# Patient Record
Sex: Male | Born: 1955 | Race: White | Hispanic: No | Marital: Married | Smoking: Never smoker
Health system: Southern US, Community
[De-identification: ages and names within clinical notes are randomized; demographics above are authoritative.]

## PROBLEM LIST (undated history)

## (undated) DIAGNOSIS — Z87442 Personal history of urinary calculi: Secondary | ICD-10-CM

## (undated) DIAGNOSIS — M545 Low back pain, unspecified: Secondary | ICD-10-CM

## (undated) DIAGNOSIS — R931 Abnormal findings on diagnostic imaging of heart and coronary circulation: Secondary | ICD-10-CM

## (undated) DIAGNOSIS — I341 Nonrheumatic mitral (valve) prolapse: Secondary | ICD-10-CM

## (undated) DIAGNOSIS — M720 Palmar fascial fibromatosis [Dupuytren]: Secondary | ICD-10-CM

## (undated) DIAGNOSIS — G500 Trigeminal neuralgia: Secondary | ICD-10-CM

## (undated) DIAGNOSIS — M1990 Unspecified osteoarthritis, unspecified site: Secondary | ICD-10-CM

## (undated) DIAGNOSIS — E119 Type 2 diabetes mellitus without complications: Secondary | ICD-10-CM

## (undated) HISTORY — DX: Type 2 diabetes mellitus without complications: E11.9

## (undated) HISTORY — PX: TRIGGER FINGER RELEASE: SHX641

## (undated) HISTORY — PX: KNEE SURGERY: SHX244

## (undated) HISTORY — DX: Palmar fascial fibromatosis (dupuytren): M72.0

## (undated) HISTORY — DX: Low back pain: M54.5

## (undated) HISTORY — DX: Abnormal findings on diagnostic imaging of heart and coronary circulation: R93.1

## (undated) HISTORY — PX: HIP SURGERY: SHX245

## (undated) HISTORY — DX: Trigeminal neuralgia: G50.0

## (undated) HISTORY — DX: Nonrheumatic mitral (valve) prolapse: I34.1

## (undated) HISTORY — PX: HAND SURGERY: SHX662

## (undated) HISTORY — DX: Low back pain, unspecified: M54.50

---

## 2006-10-21 ENCOUNTER — Ambulatory Visit: Payer: Self-pay

## 2006-12-16 ENCOUNTER — Ambulatory Visit: Payer: Self-pay | Admitting: General Practice

## 2006-12-23 ENCOUNTER — Ambulatory Visit: Payer: Self-pay | Admitting: General Practice

## 2007-05-18 ENCOUNTER — Ambulatory Visit: Payer: Self-pay | Admitting: Gastroenterology

## 2008-06-01 ENCOUNTER — Ambulatory Visit: Payer: Self-pay | Admitting: Orthopedic Surgery

## 2008-06-07 ENCOUNTER — Ambulatory Visit: Payer: Self-pay | Admitting: Orthopedic Surgery

## 2009-07-30 ENCOUNTER — Ambulatory Visit: Payer: Self-pay | Admitting: General Surgery

## 2009-12-25 ENCOUNTER — Ambulatory Visit (HOSPITAL_COMMUNITY): Admission: RE | Admit: 2009-12-25 | Discharge: 2009-12-25 | Payer: Self-pay | Admitting: Orthopedic Surgery

## 2011-06-02 ENCOUNTER — Ambulatory Visit: Payer: Self-pay | Admitting: Orthopedic Surgery

## 2011-06-12 ENCOUNTER — Ambulatory Visit: Payer: Self-pay | Admitting: Orthopedic Surgery

## 2011-08-25 DIAGNOSIS — M25559 Pain in unspecified hip: Secondary | ICD-10-CM | POA: Insufficient documentation

## 2011-09-02 DIAGNOSIS — S73191A Other sprain of right hip, initial encounter: Secondary | ICD-10-CM | POA: Insufficient documentation

## 2011-12-15 DIAGNOSIS — M5416 Radiculopathy, lumbar region: Secondary | ICD-10-CM | POA: Insufficient documentation

## 2012-04-29 ENCOUNTER — Other Ambulatory Visit: Payer: Self-pay

## 2013-02-15 ENCOUNTER — Emergency Department: Payer: Self-pay | Admitting: Emergency Medicine

## 2013-02-15 LAB — URINALYSIS, COMPLETE
Bacteria: NONE SEEN
Bilirubin,UR: NEGATIVE
Glucose,UR: >=500 mg/dL (ref 0–75)
Leukocyte Esterase: NEGATIVE
Nitrite: NEGATIVE
Ph: 8 (ref 4.5–8.0)
Protein: 30
RBC,UR: 62 /HPF (ref 0–5)
Specific Gravity: 1.02 (ref 1.003–1.030)
Squamous Epithelial: <1
WBC UR: 2 /HPF (ref 0–5)

## 2013-02-15 LAB — COMPREHENSIVE METABOLIC PANEL
Albumin: 3.8 g/dL (ref 3.4–5.0)
Alkaline Phosphatase: 66 U/L (ref 50–136)
Anion Gap: 2 — ABNORMAL LOW (ref 7–16)
BUN: 20 mg/dL — ABNORMAL HIGH (ref 7–18)
Bilirubin,Total: 0.5 mg/dL (ref 0.2–1.0)
Calcium, Total: 9.1 mg/dL (ref 8.5–10.1)
Chloride: 104 mmol/L (ref 98–107)
Co2: 30 mmol/L (ref 21–32)
Creatinine: 1.24 mg/dL (ref 0.60–1.30)
EGFR (African American): >60
EGFR (Non-African Amer.): >60
Glucose: 200 mg/dL — ABNORMAL HIGH (ref 65–99)
Osmolality: 280 (ref 275–301)
Potassium: 4.2 mmol/L (ref 3.5–5.1)
SGOT(AST): 25 U/L (ref 15–37)
SGPT (ALT): 20 U/L (ref 12–78)
Sodium: 136 mmol/L (ref 136–145)
Total Protein: 7.1 g/dL (ref 6.4–8.2)

## 2013-02-15 LAB — CBC
HCT: 39.2 % — ABNORMAL LOW (ref 40.0–52.0)
HGB: 13.7 g/dL (ref 13.0–18.0)
MCH: 32.1 pg (ref 26.0–34.0)
MCHC: 35 g/dL (ref 32.0–36.0)
MCV: 92 fL (ref 80–100)
Platelet: 211 10*3/uL (ref 150–440)
RBC: 4.28 10*6/uL — ABNORMAL LOW (ref 4.40–5.90)
RDW: 12.5 % (ref 11.5–14.5)
WBC: 8.7 10*3/uL (ref 3.8–10.6)

## 2013-02-15 LAB — LIPASE, BLOOD: Lipase: 133 U/L (ref 73–393)

## 2013-03-13 ENCOUNTER — Ambulatory Visit: Payer: Self-pay | Admitting: Podiatry

## 2014-07-27 DIAGNOSIS — M76892 Other specified enthesopathies of left lower limb, excluding foot: Secondary | ICD-10-CM | POA: Insufficient documentation

## 2014-09-17 ENCOUNTER — Ambulatory Visit
Admit: 2014-09-17 | Disposition: A | Discharge status: Home / Self Care | Payer: Self-pay | Attending: Gastroenterology | Admitting: Gastroenterology

## 2014-10-12 ENCOUNTER — Emergency Department
Admission: EM | Admit: 2014-10-12 | Discharge: 2014-10-13 | Disposition: A | Discharge status: Home / Self Care | Payer: No Typology Code available for payment source | Attending: Emergency Medicine | Admitting: Emergency Medicine

## 2014-10-12 ENCOUNTER — Encounter: Payer: Self-pay | Admitting: *Deleted

## 2014-10-12 ENCOUNTER — Emergency Department: Payer: No Typology Code available for payment source

## 2014-10-12 ENCOUNTER — Other Ambulatory Visit: Payer: Self-pay

## 2014-10-12 DIAGNOSIS — Z79899 Other long term (current) drug therapy: Secondary | ICD-10-CM | POA: Diagnosis not present

## 2014-10-12 DIAGNOSIS — R0789 Other chest pain: Secondary | ICD-10-CM

## 2014-10-12 DIAGNOSIS — S3991XA Unspecified injury of abdomen, initial encounter: Secondary | ICD-10-CM | POA: Diagnosis not present

## 2014-10-12 DIAGNOSIS — Y92410 Unspecified street and highway as the place of occurrence of the external cause: Secondary | ICD-10-CM | POA: Insufficient documentation

## 2014-10-12 DIAGNOSIS — Y998 Other external cause status: Secondary | ICD-10-CM | POA: Diagnosis not present

## 2014-10-12 DIAGNOSIS — R079 Chest pain, unspecified: Secondary | ICD-10-CM

## 2014-10-12 DIAGNOSIS — S299XXA Unspecified injury of thorax, initial encounter: Secondary | ICD-10-CM | POA: Insufficient documentation

## 2014-10-12 DIAGNOSIS — S199XXA Unspecified injury of neck, initial encounter: Secondary | ICD-10-CM | POA: Insufficient documentation

## 2014-10-12 DIAGNOSIS — S3992XA Unspecified injury of lower back, initial encounter: Secondary | ICD-10-CM | POA: Diagnosis not present

## 2014-10-12 DIAGNOSIS — S0990XA Unspecified injury of head, initial encounter: Secondary | ICD-10-CM

## 2014-10-12 DIAGNOSIS — Z7982 Long term (current) use of aspirin: Secondary | ICD-10-CM | POA: Insufficient documentation

## 2014-10-12 DIAGNOSIS — Y9389 Activity, other specified: Secondary | ICD-10-CM | POA: Diagnosis not present

## 2014-10-12 MED ORDER — CYCLOBENZAPRINE HCL 10 MG PO TABS
5.0000 mg | ORAL_TABLET | Freq: Once | ORAL | Status: AC
  Administered 2014-10-13: 5 mg via ORAL

## 2014-10-12 MED ORDER — OXYCODONE-ACETAMINOPHEN 5-325 MG PO TABS: 1.0000 | ORAL_TABLET | Freq: Four times a day (QID) | ORAL | Status: DC | PRN

## 2014-10-12 MED ORDER — METHOCARBAMOL 500 MG PO TABS: 500.0000 mg | ORAL_TABLET | Freq: Four times a day (QID) | ORAL | Status: DC

## 2014-10-12 MED ORDER — OXYCODONE-ACETAMINOPHEN 5-325 MG PO TABS
1.0000 | ORAL_TABLET | Freq: Once | ORAL | Status: AC
  Administered 2014-10-13: 1 via ORAL

## 2014-10-12 NOTE — ED Provider Notes (Signed)
ED ECG REPORT I, Charlsie Fleeger W, the attending physician, personally viewed and interpreted this ECG.   Date: 10/12/2014  EKG Time: 2155  Rate: 66  Rhythm: normal EKG, normal sinus rhythm, unchanged from previous tracings  Axis: 61  Intervals: within normal limits  ST&T Change: no ST changes   Ahmed Prima, MD 10/12/14 2340
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
## 2014-10-13 MED ORDER — OXYCODONE-ACETAMINOPHEN 5-325 MG PO TABS
ORAL_TABLET | ORAL | Status: AC
  Administered 2014-10-13: 1 via ORAL
  Filled 2014-10-13: qty 1

## 2014-10-13 MED ORDER — CYCLOBENZAPRINE HCL 10 MG PO TABS
ORAL_TABLET | ORAL | Status: AC
  Administered 2014-10-13: 5 mg via ORAL
  Filled 2014-10-13: qty 1
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
## 2014-10-24 ENCOUNTER — Encounter: Admission: RE | Payer: Self-pay | Source: Ambulatory Visit

## 2014-10-24 ENCOUNTER — Ambulatory Visit: Admission: RE | Admit: 2014-10-24 | Payer: Self-pay | Source: Ambulatory Visit | Admitting: Gastroenterology

## 2014-10-24 SURGERY — MANOMETRY, ESOPHAGUS

## 2014-10-29 ENCOUNTER — Encounter: Payer: Self-pay | Admitting: Family Medicine

## 2015-02-19 ENCOUNTER — Ambulatory Visit (INDEPENDENT_AMBULATORY_CARE_PROVIDER_SITE_OTHER): Payer: BLUE CROSS/BLUE SHIELD | Admitting: Family Medicine

## 2015-02-19 ENCOUNTER — Encounter: Payer: Self-pay | Admitting: Family Medicine

## 2015-02-19 VITALS — BP 138/81 | HR 59 | Temp 97.4°F | Ht 72.6 in | Wt 196.0 lb

## 2015-02-19 DIAGNOSIS — Z0000 Encounter for general adult medical examination without abnormal findings: Secondary | ICD-10-CM | POA: Diagnosis not present

## 2015-02-19 DIAGNOSIS — E119 Type 2 diabetes mellitus without complications: Secondary | ICD-10-CM

## 2015-02-19 LAB — URINALYSIS, ROUTINE W REFLEX MICROSCOPIC
Bilirubin, UA: NEGATIVE
Glucose, UA: NEGATIVE
Leukocytes, UA: NEGATIVE
Nitrite, UA: NEGATIVE
Protein, UA: NEGATIVE
RBC, UA: NEGATIVE
Specific Gravity, UA: 1.02 (ref 1.005–1.030)
Urobilinogen, Ur: 0.2 mg/dL (ref 0.2–1.0)
pH, UA: 5.5 (ref 5.0–7.5)

## 2015-02-19 NOTE — Progress Notes (Signed)
   BP 138/81 mmHg  Pulse 59  Temp(Src) 97.4 F (36.3 C)  Ht 6' 0.6" (1.844 m)  Wt 196 lb (88.905 kg)  BMI 26.15 kg/m2  SpO2 99%   Subjective:    Patient ID: Christopher Robins., male    DOB: 1956-02-13, 59 y.o.   MRN: [id number]  HPI: Christopher Fridman. is a 59 y.o. male  Chief Complaint  Patient presents with  . Annual Exam    status post labral tear repair left hip doing well no further pain.  Relevant past medical, surgical, family and social history reviewed and updated as indicated. Interim medical history since our last visit reviewed. Allergies and medications reviewed and updated.  Review of Systems  Constitutional: Negative.   HENT: Negative.   Eyes: Negative.   Respiratory: Negative.   Cardiovascular: Negative.   Gastrointestinal: Negative.   Endocrine: Negative.   Genitourinary: Negative.   Musculoskeletal: Negative.   Skin: Negative.   Allergic/Immunologic: Negative.   Neurological: Negative.   Hematological: Negative.   Psychiatric/Behavioral: Negative.     Per HPI unless specifically indicated above     Objective:    BP 138/81 mmHg  Pulse 59  Temp(Src) 97.4 F (36.3 C)  Ht 6' 0.6" (1.844 m)  Wt 196 lb (88.905 kg)  BMI 26.15 kg/m2  SpO2 99%  Wt Readings from Last 3 Encounters:  02/19/15 196 lb (88.905 kg)  10/12/14 200 lb (90.719 kg)  08/29/14 199 lb (90.266 kg)    Physical Exam  Constitutional: He is oriented to person, place, and time. He appears well-developed and well-nourished.  HENT:  Head: Normocephalic and atraumatic.  Right Ear: External ear normal.  Left Ear: External ear normal.  Eyes: Conjunctivae and EOM are normal. Pupils are equal, round, and reactive to light.  Neck: Normal range of motion. Neck supple.  Cardiovascular: Normal rate, regular rhythm, normal heart sounds and intact distal pulses.   Pulmonary/Chest: Effort normal and breath sounds normal.  Abdominal: Soft. Bowel sounds are normal. There is no  splenomegaly or hepatomegaly.  Genitourinary: Rectum normal, prostate normal and penis normal.  Musculoskeletal: Normal range of motion.  Multiple varicosities both legs  Neurological: He is alert and oriented to person, place, and time. He has normal reflexes.  Skin: No rash noted. No erythema.  Psychiatric: He has a normal mood and affect. His behavior is normal. Judgment and thought content normal.        Assessment & Plan:   Problem List Items Addressed This Visit    None    Visit Diagnoses    PE (physical exam), annual    -  Primary    Relevant Orders    Comprehensive metabolic panel    Lipid panel    CBC with Differential/Platelet    PSA    Urinalysis, Routine w reflex microscopic (not at [facility])    TSH        Follow up plan: Return if symptoms worsen or fail to improve, for Physical Exam.

## 2015-02-20 ENCOUNTER — Encounter: Payer: Self-pay | Admitting: Family Medicine

## 2015-02-20 DIAGNOSIS — E119 Type 2 diabetes mellitus without complications: Secondary | ICD-10-CM

## 2015-02-20 HISTORY — DX: Type 2 diabetes mellitus without complications: E11.9

## 2015-02-20 LAB — COMPREHENSIVE METABOLIC PANEL
ALT: 15 IU/L (ref 0–44)
AST: 21 IU/L (ref 0–40)
Albumin/Globulin Ratio: 1.8 (ref 1.1–2.5)
Albumin: 4.3 g/dL (ref 3.5–5.5)
Alkaline Phosphatase: 57 IU/L (ref 39–117)
BUN/Creatinine Ratio: 13 (ref 9–20)
BUN: 13 mg/dL (ref 6–24)
Bilirubin Total: 0.6 mg/dL (ref 0.0–1.2)
CO2: 25 mmol/L (ref 18–29)
Calcium: 9.5 mg/dL (ref 8.7–10.2)
Chloride: 98 mmol/L (ref 97–108)
Creatinine, Ser: 0.97 mg/dL (ref 0.76–1.27)
GFR calc Af Amer: 100 mL/min/{1.73_m2} (ref >59)
GFR calc non Af Amer: 86 mL/min/{1.73_m2} (ref >59)
Globulin, Total: 2.4 g/dL (ref 1.5–4.5)
Glucose: 126 mg/dL — ABNORMAL HIGH (ref 65–99)
Potassium: 4.4 mmol/L (ref 3.5–5.2)
Sodium: 139 mmol/L (ref 134–144)
Total Protein: 6.7 g/dL (ref 6.0–8.5)

## 2015-02-20 LAB — CBC WITH DIFFERENTIAL/PLATELET
Basophils Absolute: 0 10*3/uL (ref 0.0–0.2)
Basos: 1 %
EOS (ABSOLUTE): 0.1 10*3/uL (ref 0.0–0.4)
Eos: 3 %
Hematocrit: 42.2 % (ref 37.5–51.0)
Hemoglobin: 14.4 g/dL (ref 12.6–17.7)
Immature Grans (Abs): 0 10*3/uL (ref 0.0–0.1)
Immature Granulocytes: 0 %
Lymphocytes Absolute: 1.4 10*3/uL (ref 0.7–3.1)
Lymphs: 38 %
MCH: 31.2 pg (ref 26.6–33.0)
MCHC: 34.1 g/dL (ref 31.5–35.7)
MCV: 92 fL (ref 79–97)
Monocytes Absolute: 0.4 10*3/uL (ref 0.1–0.9)
Monocytes: 11 %
Neutrophils Absolute: 1.8 10*3/uL (ref 1.4–7.0)
Neutrophils: 47 %
Platelets: 259 10*3/uL (ref 150–379)
RBC: 4.61 x10E6/uL (ref 4.14–5.80)
RDW: 12.5 % (ref 12.3–15.4)
WBC: 3.8 10*3/uL (ref 3.4–10.8)

## 2015-02-20 LAB — LIPID PANEL
Chol/HDL Ratio: 4.1 ratio units (ref 0.0–5.0)
Cholesterol, Total: 224 mg/dL — ABNORMAL HIGH (ref 100–199)
HDL: 54 mg/dL (ref >39)
LDL Calculated: 159 mg/dL — ABNORMAL HIGH (ref 0–99)
Triglycerides: 54 mg/dL (ref 0–149)
VLDL Cholesterol Cal: 11 mg/dL (ref 5–40)

## 2015-02-20 LAB — PSA: Prostate Specific Ag, Serum: 1 ng/mL (ref 0.0–4.0)

## 2015-02-20 LAB — TSH: TSH: 2.79 u[IU]/mL (ref 0.450–4.500)

## 2015-02-20 NOTE — Addendum Note (Signed)
Addended byGolden Pop on: 02/20/2015 10:57 AM   Modules accepted: Orders, SmartSet

## 2015-02-21 ENCOUNTER — Encounter: Payer: Self-pay | Admitting: Family Medicine

## 2015-02-21 LAB — SPECIMEN STATUS REPORT

## 2015-02-21 LAB — HGB A1C W/O EAG: Hgb A1c MFr Bld: 6.5 % — ABNORMAL HIGH (ref 4.8–5.6)

## 2015-03-08 ENCOUNTER — Telehealth: Payer: Self-pay | Admitting: Unknown Physician Specialty

## 2015-03-08 ENCOUNTER — Encounter: Payer: Self-pay | Admitting: Unknown Physician Specialty

## 2015-03-08 ENCOUNTER — Ambulatory Visit (INDEPENDENT_AMBULATORY_CARE_PROVIDER_SITE_OTHER): Payer: BLUE CROSS/BLUE SHIELD | Admitting: Unknown Physician Specialty

## 2015-03-08 VITALS — BP 117/74 | HR 79 | Temp 98.5°F | Ht 73.0 in | Wt 202.8 lb

## 2015-03-08 DIAGNOSIS — J029 Acute pharyngitis, unspecified: Secondary | ICD-10-CM | POA: Diagnosis not present

## 2015-03-08 DIAGNOSIS — J069 Acute upper respiratory infection, unspecified: Secondary | ICD-10-CM | POA: Diagnosis not present

## 2015-03-08 DIAGNOSIS — R21 Rash and other nonspecific skin eruption: Secondary | ICD-10-CM

## 2015-03-08 LAB — CBC WITH DIFFERENTIAL/PLATELET
Hematocrit: 41 % (ref 37.5–51.0)
Hemoglobin: 13.9 g/dL (ref 12.6–17.7)
Lymphocytes Absolute: 1.4 10*3/uL (ref 0.7–3.1)
Lymphs: 13 %
MCH: 31.7 pg (ref 26.6–33.0)
MCHC: 33.9 g/dL (ref 31.5–35.7)
MCV: 94 fL (ref 79–97)
MID (Absolute): 0.6 10*3/uL (ref 0.1–1.6)
MID: 5 %
Neutrophils Absolute: 9 10*3/uL — ABNORMAL HIGH (ref 1.4–7.0)
Neutrophils: 82 %
Platelets: 224 10*3/uL (ref 150–379)
RBC: 4.38 x10E6/uL (ref 4.14–5.80)
RDW: 12.6 % (ref 12.3–15.4)
WBC: 11 10*3/uL — ABNORMAL HIGH (ref 3.4–10.8)

## 2015-03-08 NOTE — Progress Notes (Signed)
BP 117/74 mmHg  Pulse 79  Temp(Src) 98.5 F (36.9 C)  Ht 6\' 1"  (1.854 m)  Wt 202 lb 12.8 oz (91.989 kg)  BMI 26.76 kg/m2  SpO2 100%   Subjective:    Patient ID: Christopher Robins., male    DOB: Feb 16, 1956, 59 y.o.   MRN: [id number]  HPI: Christopher Meuser. is a 59 y.o. male  Chief Complaint  Patient presents with  . Headache  . Nasal Congestion  . Cough  . Sore Throat  . Fatigue  . Rash   URI  This is a new problem. Episode onset: For 3 days. The problem has been unchanged. There has been no fever. Associated symptoms include coughing, a rash, rhinorrhea and a sore throat. He has tried decongestant and antihistamine for the symptoms. The treatment provided no relief.   Pt states he is getting a "chill" he gets a hive type rash on his arms that does not itch.  It fades after about 30 minutes.    Relevant past medical, surgical, family and social history reviewed and updated as indicated. Interim medical history since our last visit reviewed. Allergies and medications reviewed and updated.  Review of Systems  HENT: Positive for rhinorrhea and sore throat.   Respiratory: Positive for cough.   Skin: Positive for rash.    Per HPI unless specifically indicated above     Objective:    BP 117/74 mmHg  Pulse 79  Temp(Src) 98.5 F (36.9 C)  Ht 6\' 1"  (1.854 m)  Wt 202 lb 12.8 oz (91.989 kg)  BMI 26.76 kg/m2  SpO2 100%  Wt Readings from Last 3 Encounters:  03/08/15 202 lb 12.8 oz (91.989 kg)  02/19/15 196 lb (88.905 kg)  10/12/14 200 lb (90.719 kg)    Physical Exam  Constitutional: He is oriented to person, place, and time. He appears well-developed and well-nourished. No distress.  HENT:  Head: Normocephalic and atraumatic.  Right Ear: Tympanic membrane and ear canal normal.  Left Ear: Tympanic membrane and ear canal normal.  Nose: Rhinorrhea present. Right sinus exhibits no maxillary sinus tenderness and no frontal sinus tenderness. Left sinus exhibits no  maxillary sinus tenderness and no frontal sinus tenderness.  Mouth/Throat: Uvula is midline. Posterior oropharyngeal edema present.  Eyes: Conjunctivae and lids are normal. Right eye exhibits no discharge. Left eye exhibits no discharge. No scleral icterus.  Neck: Neck supple.  Cardiovascular: Normal rate, regular rhythm and normal heart sounds.   Pulmonary/Chest: Effort normal and breath sounds normal. No respiratory distress.  Abdominal: Normal appearance. There is no splenomegaly or hepatomegaly.  Musculoskeletal: Normal range of motion.  Neurological: He is alert and oriented to person, place, and time.  Skin: Skin is warm, dry and intact. Rash noted. Rash is not pustular. No pallor.  Flat blotchy erythemetous rash  Psychiatric: He has a normal mood and affect. His behavior is normal. Judgment and thought content normal.  Nursing note and vitals reviewed.  Strep i negative WBC is 11K    Assessment & Plan:   Problem List Items Addressed This Visit    None    Visit Diagnoses    URI (upper respiratory infection)    -  Primary    Relevant Orders    CBC With Differential/Platelet    Strep Gp A Ag, IA W/Reflex    Pharyngitis        Relevant Orders    CBC With Differential/Platelet    Strep Gp A Ag, IA W/Reflex  Rash        Relevant Orders    CBC With Differential/Platelet    Strep Gp A Ag, IA W/Reflex       Discussed with Dr. Wynetta Emery who came in and evaluated patient.  Probably viral exanthem.   Discussed blod sugar while here.  Encouraged 6 month f/u  Follow up plan: Return if symptoms worsen or fail to improve.

## 2015-03-08 NOTE — Telephone Encounter (Signed)
Pt has been added to CW's schedule @ 1:30pm for a sick visit. Thanks.

## 2015-03-11 LAB — STREP GP A AG, IA W/REFLEX: Strep A Culture: NEGATIVE

## 2015-07-09 ENCOUNTER — Encounter: Payer: Self-pay | Admitting: Family Medicine

## 2015-08-12 ENCOUNTER — Telehealth: Payer: Self-pay | Admitting: Family Medicine

## 2015-08-12 MED ORDER — OSELTAMIVIR PHOSPHATE 75 MG PO CAPS: 75.0000 mg | ORAL_CAPSULE | Freq: Every day | ORAL | Status: DC

## 2015-08-12 NOTE — Telephone Encounter (Signed)
Pts wife has been diagnosed with the flu and he would like to have tamiflu called in to [facility] on [address]. He would like a call to know when its done. thanks

## 2015-08-12 NOTE — Telephone Encounter (Signed)
Expand All Collapse All   Pts wife has been diagnosed with the flu and he would like to have tamiflu called in to [facility] on [address]. He would like a call to know when its done. thanks       dome

## 2015-08-14 ENCOUNTER — Telehealth: Payer: Self-pay | Admitting: Family Medicine

## 2015-08-14 NOTE — Telephone Encounter (Signed)
Patient to up Tamiflu to BID per MJ

## 2015-08-14 NOTE — Telephone Encounter (Signed)
Pt started taking tamiflu 2 days ago but now feels worse (mucous is now brown and has temp of 101) and wondered if he should wait it out and continue with it or if he needs to come in for an appt to make sure its not pneumonia.

## 2015-08-15 ENCOUNTER — Telehealth: Payer: Self-pay | Admitting: Family Medicine

## 2015-08-15 NOTE — Telephone Encounter (Signed)
Patient has questions about his flu symptoms and would like to talk to [place] about it or a provider. He stated that he has a worse sore throat, maybe thinks its strep throat. His number is [phone].

## 2015-08-15 NOTE — Telephone Encounter (Signed)
Patient made appointment for tomorrow 08/16/15.

## 2015-08-16 ENCOUNTER — Encounter: Payer: Self-pay | Admitting: Family Medicine

## 2015-08-16 ENCOUNTER — Ambulatory Visit (INDEPENDENT_AMBULATORY_CARE_PROVIDER_SITE_OTHER): Payer: BLUE CROSS/BLUE SHIELD | Admitting: Family Medicine

## 2015-08-16 VITALS — BP 122/77 | HR 70 | Temp 97.8°F | Ht 73.0 in | Wt 200.8 lb

## 2015-08-16 DIAGNOSIS — J111 Influenza due to unidentified influenza virus with other respiratory manifestations: Secondary | ICD-10-CM

## 2015-08-16 DIAGNOSIS — J029 Acute pharyngitis, unspecified: Secondary | ICD-10-CM | POA: Diagnosis not present

## 2015-08-16 NOTE — Progress Notes (Signed)
BP 122/77 mmHg  Pulse 70  Temp(Src) 97.8 F (36.6 C)  Ht 6\' 1"  (1.854 m)  Wt 200 lb 12.8 oz (91.082 kg)  BMI 26.50 kg/m2  SpO2 99%   Subjective:    Patient ID: Christopher Robins., male    DOB: 27-Feb-1956, 60 y.o.   MRN: [id number]  HPI: Christopher Dacres. is a 60 y.o. male  Chief Complaint  Patient presents with  . Influenza    Patient stated he is getting over the flu. His throat and ear now hurts.   . Sore Throat  . Ear Pain    Left Ear   UPPER RESPIRATORY TRACT INFECTION- still on tamiflu, has only been on it for 2 days Duration: Tuesday Worst symptom: sore throat Fever: yes Cough: yes Shortness of breath: no Wheezing: no Chest pain: no Chest tightness: yes Chest congestion: yes Nasal congestion: no Runny nose: yes Post nasal drip: no Sneezing: yes Sore throat: yes Swollen glands: yes Sinus pressure: no Headache: yes Face pain: no Toothache: no Ear pain: yes left Ear pressure: no  Eyes red/itching:no Eye drainage/crusting: no  Vomiting: no Rash: no Fatigue: yes Sick contacts: yes Strep contacts: no  Context: better Recurrent sinusitis: no Relief with OTC cold/cough medications: no  Treatments attempted: tamiflu   Relevant past medical, surgical, family and social history reviewed and updated as indicated. Interim medical history since our last visit reviewed. Allergies and medications reviewed and updated.  Review of Systems  Constitutional: Negative.   HENT: Positive for congestion, ear pain, postnasal drip, sinus pressure and sore throat. Negative for dental problem, drooling, ear discharge, facial swelling, hearing loss, mouth sores, nosebleeds, rhinorrhea, sneezing, tinnitus, trouble swallowing and voice change.   Respiratory: Positive for cough. Negative for apnea, choking, chest tightness, shortness of breath, wheezing and stridor.   Cardiovascular: Negative for chest pain, palpitations and leg swelling.  Psychiatric/Behavioral:  Negative.     Per HPI unless specifically indicated above     Objective:    BP 122/77 mmHg  Pulse 70  Temp(Src) 97.8 F (36.6 C)  Ht 6\' 1"  (1.854 m)  Wt 200 lb 12.8 oz (91.082 kg)  BMI 26.50 kg/m2  SpO2 99%  Wt Readings from Last 3 Encounters:  08/16/15 200 lb 12.8 oz (91.082 kg)  03/08/15 202 lb 12.8 oz (91.989 kg)  02/19/15 196 lb (88.905 kg)    Physical Exam  Constitutional: He is oriented to person, place, and time. He appears well-developed and well-nourished. No distress.  HENT:  Head: Normocephalic and atraumatic.  Right Ear: Hearing and external ear normal.  Left Ear: Hearing and external ear normal.  Nose: Nose normal.  Mouth/Throat: Oropharynx is clear and moist. No oropharyngeal exudate.  Eyes: Conjunctivae, EOM and lids are normal. Pupils are equal, round, and reactive to light. Right eye exhibits no discharge. Left eye exhibits no discharge. No scleral icterus.  Neck: Normal range of motion. Neck supple. No JVD present. No tracheal deviation present. No thyromegaly present.  Cardiovascular: Normal rate, regular rhythm, normal heart sounds and intact distal pulses.  Exam reveals no gallop and no friction rub.   No murmur heard. Pulmonary/Chest: Effort normal and breath sounds normal. No stridor. No respiratory distress. He has no wheezes. He has no rales. He exhibits no tenderness.  Musculoskeletal: Normal range of motion.  Lymphadenopathy:    He has cervical adenopathy.  Neurological: He is alert and oriented to person, place, and time.  Skin: Skin is warm, dry and intact. No  rash noted. He is not diaphoretic. No erythema. No pallor.  Psychiatric: He has a normal mood and affect. His speech is normal and behavior is normal. Judgment and thought content normal. Cognition and memory are normal.  Nursing note and vitals reviewed.   Results for orders placed or performed in visit on 03/08/15  Strep Gp A Ag, IA W/Reflex  Result Value Ref Range   Strep A Culture  Negative   CBC With Differential/Platelet  Result Value Ref Range   WBC 11.0 (H) 3.4 - 10.8 x10E3/uL   RBC 4.38 4.14 - 5.80 x10E6/uL   Hemoglobin 13.9 12.6 - 17.7 g/dL   Hematocrit 41.0 37.5 - 51.0 %   MCV 94 79 - 97 fL   MCH 31.7 26.6 - 33.0 pg   MCHC 33.9 31.5 - 35.7 g/dL   RDW 12.6 12.3 - 15.4 %   Platelets 224 150 - 379 x10E3/uL   Neutrophils 82 %   Lymphs 13 %   MID 5 %   Neutrophils Absolute 9.0 (H) 1.4 - 7.0 x10E3/uL   Lymphocytes Absolute 1.4 0.7 - 3.1 x10E3/uL   MID (Absolute) 0.6 0.1 - 1.6 X10E3/uL      Assessment & Plan:   Problem List Items Addressed This Visit    None    Visit Diagnoses    Influenza    -  Primary    Discussed nature of flu and natural progression. Will continue tamiflu until it's done. Out of work for 1 week after initially got sick (Tuesday). Call if worse    Sore throat        Strep negative     Relevant Orders    Rapid strep screen (not at [facility])        Follow up plan: Return if symptoms worsen or fail to improve.

## 2015-08-19 LAB — CULTURE, GROUP A STREP

## 2015-08-19 LAB — RAPID STREP SCREEN (MED CTR MEBANE ONLY): Strep Gp A Ag, IA W/Reflex: NEGATIVE

## 2015-08-20 ENCOUNTER — Telehealth: Payer: Self-pay | Admitting: Family Medicine

## 2015-08-20 MED ORDER — AMOXICILLIN 500 MG PO CAPS: 500.0000 mg | ORAL_CAPSULE | Freq: Two times a day (BID) | ORAL | Status: DC

## 2015-08-20 NOTE — Telephone Encounter (Signed)
Please let him know that his throat culture grew out strep. Not usually the strep that causes strep throat, but I'm going to treat him with an antibiotic. I sent it to his pharmacy- any questions let me know

## 2015-08-20 NOTE — Telephone Encounter (Signed)
Tried to call patient, no answer, unable to leave a message. Will try again tomorrow.  

## 2015-08-21 NOTE — Telephone Encounter (Signed)
Tried to call patient, no answer, unable to leave a message. 

## 2015-08-21 NOTE — Telephone Encounter (Signed)
Tried to call patient, no answer.  Spoke with pharmacy, patient has not picked up Rx

## 2015-08-23 ENCOUNTER — Encounter: Payer: Self-pay | Admitting: Family Medicine

## 2015-08-23 NOTE — Telephone Encounter (Signed)
Let's send him a letter. Thanks

## 2015-08-23 NOTE — Telephone Encounter (Signed)
Unable to contact patient by phone. Letter generated and to be sent.

## 2015-11-01 ENCOUNTER — Ambulatory Visit (INDEPENDENT_AMBULATORY_CARE_PROVIDER_SITE_OTHER): Payer: BLUE CROSS/BLUE SHIELD | Admitting: Family Medicine

## 2015-11-01 ENCOUNTER — Encounter: Payer: Self-pay | Admitting: Family Medicine

## 2015-11-01 VITALS — BP 124/72 | HR 68 | Temp 98.3°F | Wt 202.0 lb

## 2015-11-01 DIAGNOSIS — I8002 Phlebitis and thrombophlebitis of superficial vessels of left lower extremity: Secondary | ICD-10-CM | POA: Diagnosis not present

## 2015-11-01 DIAGNOSIS — E119 Type 2 diabetes mellitus without complications: Secondary | ICD-10-CM | POA: Diagnosis not present

## 2015-11-01 DIAGNOSIS — R7301 Impaired fasting glucose: Secondary | ICD-10-CM | POA: Diagnosis not present

## 2015-11-01 LAB — BAYER DCA HB A1C WAIVED: HB A1C (BAYER DCA - WAIVED): 6.2 % (ref <7.0)

## 2015-11-01 MED ORDER — SULFAMETHOXAZOLE-TRIMETHOPRIM 800-160 MG PO TABS: 1.0000 | ORAL_TABLET | Freq: Two times a day (BID) | ORAL | Status: DC

## 2015-11-01 NOTE — Progress Notes (Signed)
BP 124/72 mmHg  Pulse 68  Temp(Src) 98.3 F (36.8 C)  Wt 202 lb (91.627 kg)  SpO2 99%   Subjective:    Patient ID: Christopher Robins., male    DOB: 03-23-1956, 60 y.o.   MRN: [id number]  HPI: Christopher Harbold. is a 60 y.o. male  Chief Complaint  Patient presents with  . Leg Pain    left lower leg, patient states that about two weeks ago he had some fever and heat in his leg, then some nummbness in his toes. He hasnt had pain just numbess, and feverish   DIABETES Hypoglycemic episodes:no Polydipsia/polyuria: no Visual disturbance: no Chest pain: no Paresthesias: no Glucose Monitoring: no Taking Insulin?: no Blood Pressure Monitoring: not checking Retinal Examination: Up to Date Foot Exam: Up to Date Diabetic Education: Completed Pneumovax: Not up to Date Influenza: Not up to Date Aspirin: yes  SKIN INFECTION Duration:  Location: anterior L shin History of trauma in area: yes Pain: yes Quality: aching and numb Severity: mildH3 Redness: yes Swelling: yes Oozing: no Pus: no Fevers: no Nausea/vomiting: no Status: better Treatments attempted:compression stockings  Tetanus: UTD  Relevant past medical, surgical, family and social history reviewed and updated as indicated. Interim medical history since our last visit reviewed. Allergies and medications reviewed and updated.  Review of Systems  Constitutional: Negative.   Respiratory: Negative.   Cardiovascular: Negative.   Psychiatric/Behavioral: Negative.     Per HPI unless specifically indicated above     Objective:    BP 124/72 mmHg  Pulse 68  Temp(Src) 98.3 F (36.8 C)  Wt 202 lb (91.627 kg)  SpO2 99%  Wt Readings from Last 3 Encounters:  11/01/15 202 lb (91.627 kg)  08/16/15 200 lb 12.8 oz (91.082 kg)  03/08/15 202 lb 12.8 oz (91.989 kg)    Physical Exam  Constitutional: He is oriented to person, place, and time. He appears well-developed and well-nourished. No distress.  HENT:  Head:  Normocephalic and atraumatic.  Right Ear: Hearing normal.  Left Ear: Hearing normal.  Nose: Nose normal.  Eyes: Conjunctivae and lids are normal. Right eye exhibits no discharge. Left eye exhibits no discharge. No scleral icterus.  Cardiovascular: Normal rate, regular rhythm, normal heart sounds and intact distal pulses.  Exam reveals no gallop and no friction rub.   No murmur heard. Pulmonary/Chest: Effort normal and breath sounds normal. No respiratory distress. He has no wheezes. He has no rales. He exhibits no tenderness.  Musculoskeletal: Normal range of motion.  Swollen veins on anterior L shin with heat and redness  Neurological: He is alert and oriented to person, place, and time.  Skin: Skin is warm, dry and intact. No rash noted. No erythema. No pallor.  Psychiatric: He has a normal mood and affect. His speech is normal and behavior is normal. Judgment and thought content normal. Cognition and memory are normal.  Nursing note and vitals reviewed.   Results for orders placed or performed in visit on 08/16/15  Rapid strep screen (not at [facility])  Result Value Ref Range   Strep Gp A Ag, IA W/Reflex Negative Negative  Culture, Group A Strep  Result Value Ref Range   Strep A Culture Comment (A)       Assessment & Plan:   Problem List Items Addressed This Visit      Endocrine   IFG (impaired fasting glucose)    Now IFG. Continue diet and exercise. Continue to monitor.      RESOLVED:  Type 2 diabetes mellitus without complication (HCC)   Relevant Orders   Bayer DCA Hb A1c Waived    Other Visit Diagnoses    Thrombophlebitis leg superficial, left    -  Primary    Will treat with bactrim. Call with any concern or if not getting better.         Follow up plan: Return if symptoms worsen or fail to improve.

## 2015-11-01 NOTE — Assessment & Plan Note (Signed)
Now IFG. Continue diet and exercise. Continue to monitor.

## 2016-02-06 ENCOUNTER — Encounter (INDEPENDENT_AMBULATORY_CARE_PROVIDER_SITE_OTHER): Payer: Self-pay

## 2016-02-13 DIAGNOSIS — M7050 Other bursitis of knee, unspecified knee: Secondary | ICD-10-CM | POA: Diagnosis not present

## 2016-02-13 DIAGNOSIS — M25562 Pain in left knee: Secondary | ICD-10-CM | POA: Diagnosis not present

## 2016-02-20 ENCOUNTER — Encounter: Payer: BLUE CROSS/BLUE SHIELD | Admitting: Family Medicine

## 2016-02-26 ENCOUNTER — Telehealth: Payer: Self-pay | Admitting: Family Medicine

## 2016-02-26 ENCOUNTER — Encounter: Payer: Self-pay | Admitting: Family Medicine

## 2016-02-26 ENCOUNTER — Ambulatory Visit (INDEPENDENT_AMBULATORY_CARE_PROVIDER_SITE_OTHER): Payer: BLUE CROSS/BLUE SHIELD | Admitting: Family Medicine

## 2016-02-26 VITALS — BP 146/79 | HR 56 | Temp 97.5°F | Ht 72.6 in | Wt 203.0 lb

## 2016-02-26 DIAGNOSIS — Z114 Encounter for screening for human immunodeficiency virus [HIV]: Secondary | ICD-10-CM

## 2016-02-26 DIAGNOSIS — E119 Type 2 diabetes mellitus without complications: Secondary | ICD-10-CM | POA: Diagnosis not present

## 2016-02-26 DIAGNOSIS — Z0000 Encounter for general adult medical examination without abnormal findings: Secondary | ICD-10-CM | POA: Diagnosis not present

## 2016-02-26 DIAGNOSIS — Z1159 Encounter for screening for other viral diseases: Secondary | ICD-10-CM

## 2016-02-26 LAB — URINALYSIS, ROUTINE W REFLEX MICROSCOPIC
Bilirubin, UA: NEGATIVE
Glucose, UA: NEGATIVE
Ketones, UA: NEGATIVE
Leukocytes, UA: NEGATIVE
Nitrite, UA: NEGATIVE
Protein, UA: NEGATIVE
RBC, UA: NEGATIVE
Specific Gravity, UA: <1.005 — ABNORMAL LOW (ref 1.005–1.030)
Urobilinogen, Ur: 0.2 mg/dL (ref 0.2–1.0)
pH, UA: 5.5 (ref 5.0–7.5)

## 2016-02-26 LAB — MICROALBUMIN, URINE WAIVED
Creatinine, Urine Waived: 50 mg/dL (ref 10–300)
Microalb, Ur Waived: 10 mg/L (ref 0–19)
Microalb/Creat Ratio: <30 mg/g (ref <30)

## 2016-02-26 LAB — BAYER DCA HB A1C WAIVED: HB A1C (BAYER DCA - WAIVED): 6.3 % (ref <7.0)

## 2016-02-26 MED ORDER — METFORMIN HCL 500 MG PO TABS: 500.0000 mg | ORAL_TABLET | Freq: Two times a day (BID) | ORAL | 4 refills | Status: DC

## 2016-02-26 MED ORDER — METFORMIN HCL 500 MG PO TABS: 500.0000 mg | ORAL_TABLET | Freq: Every day | ORAL | 4 refills | Status: DC

## 2016-02-26 NOTE — Assessment & Plan Note (Signed)
Discussed type 2 diabetes and use of metformin will start metformin 500 mg at bedtime one a day

## 2016-02-26 NOTE — Telephone Encounter (Signed)
Ms Einbinder called and stated that the pt would like a call back with dosage clarification for his metFORMIN (GLUCOPHAGE) 500 MG tablet.

## 2016-02-26 NOTE — Telephone Encounter (Signed)
Patient states he was told to take Metformin QHS, Rx says BID.  He knows QHS is correct

## 2016-02-26 NOTE — Progress Notes (Signed)
BP (!) 146/79 (BP Location: Left Arm, Patient Position: Sitting, Cuff Size: Normal)   Pulse (!) 56   Temp 97.5 F (36.4 C)   Ht 6' 0.6" (1.844 m)   Wt 203 lb (92.1 kg)   SpO2 95%   BMI 27.08 kg/m    Subjective:    Patient ID: Christopher Robins., male    DOB: [birth year], 60 y.o.   MRN: [id number]  HPI: Christopher Zachry. is a 60 y.o. male  Chief Complaint  Patient presents with  . Annual Exam  Patient follow-up all in all doing well still having some intermittent nonspecific right. Umbilical discomfort from time to time workup about 4 years ago was entirely negative including ultrasound and endoscopy. Presumed to be a intermittent muscle strain or hernia. Patient also on chart review last hemoglobin A year ago was 6.5 discussed use of metformin prediabetes and diagnosis of diabetes.  Relevant past medical, surgical, family and social history reviewed and updated as indicated. Interim medical history since our last visit reviewed. Allergies and medications reviewed and updated.  Review of Systems  Constitutional: Negative.   HENT: Negative.   Eyes: Negative.   Respiratory: Negative.   Cardiovascular: Negative.   Gastrointestinal: Negative.   Endocrine: Negative.   Genitourinary: Negative.   Musculoskeletal: Negative.   Skin: Negative.   Allergic/Immunologic: Negative.   Neurological: Negative.   Hematological: Negative.   Psychiatric/Behavioral: Negative.     Per HPI unless specifically indicated above     Objective:    BP (!) 146/79 (BP Location: Left Arm, Patient Position: Sitting, Cuff Size: Normal)   Pulse (!) 56   Temp 97.5 F (36.4 C)   Ht 6' 0.6" (1.844 m)   Wt 203 lb (92.1 kg)   SpO2 95%   BMI 27.08 kg/m   Wt Readings from Last 3 Encounters:  02/26/16 203 lb (92.1 kg)  11/01/15 202 lb (91.6 kg)  08/16/15 200 lb 12.8 oz (91.1 kg)    Physical Exam  Constitutional: He is oriented to person, place, and time. He appears well-developed and  well-nourished.  HENT:  Head: Normocephalic and atraumatic.  Right Ear: External ear normal.  Left Ear: External ear normal.  Eyes: Conjunctivae and EOM are normal. Pupils are equal, round, and reactive to light.  Neck: Normal range of motion. Neck supple.  Cardiovascular: Normal rate, regular rhythm, normal heart sounds and intact distal pulses.   Pulmonary/Chest: Effort normal and breath sounds normal.  Abdominal: Soft. Bowel sounds are normal. There is no splenomegaly or hepatomegaly.  Genitourinary: Rectum normal, prostate normal and penis normal.  Musculoskeletal: Normal range of motion.  Neurological: He is alert and oriented to person, place, and time. He has normal reflexes.  Skin: No rash noted. No erythema.  Psychiatric: He has a normal mood and affect. His behavior is normal. Judgment and thought content normal.    Results for orders placed or performed in visit on 11/01/15  Bayer DCA Hb A1c Waived  Result Value Ref Range   Bayer DCA Hb A1c Waived 6.2 <7.0 %      Assessment & Plan:   Problem List Items Addressed This Visit      Endocrine   Diabetes mellitus type 2 in nonobese [facility])    Discussed type 2 diabetes and use of metformin will start metformin 500 mg at bedtime one a day      Relevant Medications   metFORMIN (GLUCOPHAGE) 500 MG tablet    Other Visit Diagnoses  Annual physical exam    -  Primary   Relevant Orders   Urinalysis, Routine w reflex microscopic (not at [facility])   CBC with Differential/Platelet   Comprehensive metabolic panel   Lipid Panel w/o Chol/HDL Ratio   PSA   TSH   Diabetes mellitus without complication (HCC)       Relevant Medications   metFORMIN (GLUCOPHAGE) 500 MG tablet   Other Relevant Orders   Bayer DCA Hb A1c Waived   Microalbumin, Urine Waived   Need for hepatitis C screening test       Relevant Orders   Hepatitis C Antibody   Screening for HIV without presence of risk factors       Relevant Orders   HIV antibody         Follow up plan: Return in about 6 months (around 08/26/2016) for Hemoglobin A1c.

## 2016-02-27 LAB — CBC WITH DIFFERENTIAL/PLATELET
Basophils Absolute: 0.1 10*3/uL (ref 0.0–0.2)
Basos: 2 %
EOS (ABSOLUTE): 0.1 10*3/uL (ref 0.0–0.4)
Eos: 2 %
Hematocrit: 41.7 % (ref 37.5–51.0)
Hemoglobin: 14 g/dL (ref 12.6–17.7)
Immature Grans (Abs): 0 10*3/uL (ref 0.0–0.1)
Immature Granulocytes: 0 %
Lymphocytes Absolute: 1.5 10*3/uL (ref 0.7–3.1)
Lymphs: 38 %
MCH: 30.7 pg (ref 26.6–33.0)
MCHC: 33.6 g/dL (ref 31.5–35.7)
MCV: 91 fL (ref 79–97)
Monocytes Absolute: 0.4 10*3/uL (ref 0.1–0.9)
Monocytes: 11 %
Neutrophils Absolute: 2 10*3/uL (ref 1.4–7.0)
Neutrophils: 47 %
Platelets: 249 10*3/uL (ref 150–379)
RBC: 4.56 x10E6/uL (ref 4.14–5.80)
RDW: 13.2 % (ref 12.3–15.4)
WBC: 4.1 10*3/uL (ref 3.4–10.8)

## 2016-02-27 LAB — COMPREHENSIVE METABOLIC PANEL
ALT: 11 IU/L (ref 0–44)
AST: 19 IU/L (ref 0–40)
Albumin/Globulin Ratio: 1.8 (ref 1.2–2.2)
Albumin: 4.3 g/dL (ref 3.5–5.5)
Alkaline Phosphatase: 60 IU/L (ref 39–117)
BUN/Creatinine Ratio: 15 (ref 9–20)
BUN: 15 mg/dL (ref 6–24)
Bilirubin Total: 0.4 mg/dL (ref 0.0–1.2)
CO2: 28 mmol/L (ref 18–29)
Calcium: 9.5 mg/dL (ref 8.7–10.2)
Chloride: 98 mmol/L (ref 96–106)
Creatinine, Ser: 0.98 mg/dL (ref 0.76–1.27)
GFR calc Af Amer: 98 mL/min/{1.73_m2} (ref >59)
GFR calc non Af Amer: 85 mL/min/{1.73_m2} (ref >59)
Globulin, Total: 2.4 g/dL (ref 1.5–4.5)
Glucose: 126 mg/dL — ABNORMAL HIGH (ref 65–99)
Potassium: 4.2 mmol/L (ref 3.5–5.2)
Sodium: 139 mmol/L (ref 134–144)
Total Protein: 6.7 g/dL (ref 6.0–8.5)

## 2016-02-27 LAB — PSA: Prostate Specific Ag, Serum: 1.4 ng/mL (ref 0.0–4.0)

## 2016-02-27 LAB — LIPID PANEL W/O CHOL/HDL RATIO
Cholesterol, Total: 221 mg/dL — ABNORMAL HIGH (ref 100–199)
HDL: 55 mg/dL (ref >39)
LDL Calculated: 155 mg/dL — ABNORMAL HIGH (ref 0–99)
Triglycerides: 57 mg/dL (ref 0–149)
VLDL Cholesterol Cal: 11 mg/dL (ref 5–40)

## 2016-02-27 LAB — TSH: TSH: 2.06 u[IU]/mL (ref 0.450–4.500)

## 2016-02-27 LAB — HIV ANTIBODY (ROUTINE TESTING W REFLEX): HIV Screen 4th Generation wRfx: NONREACTIVE

## 2016-02-27 LAB — HEPATITIS C ANTIBODY: Hep C Virus Ab: <0.1 s/co ratio (ref 0.0–0.9)

## 2016-02-28 ENCOUNTER — Telehealth: Payer: Self-pay | Admitting: Family Medicine

## 2016-02-28 DIAGNOSIS — E7800 Pure hypercholesterolemia, unspecified: Secondary | ICD-10-CM

## 2016-02-28 NOTE — Telephone Encounter (Signed)
Pt called stated he needs a letter from Dr. Jeananne Rama stating that he has had his physical. Please fax letter to [phone]. Thanks.

## 2016-03-02 DIAGNOSIS — E1169 Type 2 diabetes mellitus with other specified complication: Secondary | ICD-10-CM | POA: Insufficient documentation

## 2016-03-02 DIAGNOSIS — E7800 Pure hypercholesterolemia, unspecified: Secondary | ICD-10-CM | POA: Insufficient documentation

## 2016-03-02 MED ORDER — ATORVASTATIN CALCIUM 40 MG PO TABS: 40.0000 mg | ORAL_TABLET | Freq: Every day | ORAL | 3 refills | Status: DC

## 2016-03-02 NOTE — Telephone Encounter (Signed)
Yes thanks 

## 2016-03-02 NOTE — Telephone Encounter (Signed)
Letter generated and on your desk for you to sign.

## 2016-03-02 NOTE — Telephone Encounter (Deleted)
Routing to provider.  Patient had his physical 02/26/2016.

## 2016-03-02 NOTE — Telephone Encounter (Signed)
Phone call Discussed with patient markedly elevated cholesterol has remained elevated this last year will start atorvastatin patient education given recheck 1 month will check lipid panel, ALT, AST.

## 2016-03-02 NOTE — Telephone Encounter (Signed)
Routing to provider.   Patient had his physical 02/26/2016.  Dr. Jeananne Rama, if you would like for me to document a letter and you sign it, I can.

## 2016-03-03 ENCOUNTER — Telehealth: Payer: Self-pay | Admitting: Family Medicine

## 2016-03-03 NOTE — Telephone Encounter (Signed)
Dr. Jeananne Rama,  Would you like me to handle this or would you like to call?

## 2016-03-03 NOTE — Telephone Encounter (Signed)
Pt's wife called stated pt asked her to call and ask for clarification about pt's cholesterol results and medications. Stated pt was at work and could not understand everything Dr. Jeananne Rama was saying due to background noise. Please call pt's wife to go over test results and new medication. Thanks.

## 2016-03-03 NOTE — Telephone Encounter (Signed)
Call wife if HIPPA OK

## 2016-03-16 ENCOUNTER — Telehealth: Payer: Self-pay | Admitting: Family Medicine

## 2016-03-16 NOTE — Telephone Encounter (Signed)
Pt called and was asking if Dr. Caryl Bis would see him for a second opinion. His current doctor started him on Lipitor and also a medication for Diabetes, wife stated that the A1C is only 6.3 and they want a second opinion on whether he should really be taking these medications. Please advise, thank you!  Call pt @ [phone] (home) [phone] (cell)

## 2016-03-16 NOTE — Telephone Encounter (Signed)
Spoke with patients wife. She is going to discuss with patient and call back tomorrow to schedule appointment

## 2016-03-16 NOTE — Telephone Encounter (Signed)
Please advise 

## 2016-03-16 NOTE — Telephone Encounter (Signed)
I am happy to see him if he is going to switch to me as his PCP.

## 2016-03-19 ENCOUNTER — Ambulatory Visit (INDEPENDENT_AMBULATORY_CARE_PROVIDER_SITE_OTHER): Payer: BLUE CROSS/BLUE SHIELD | Admitting: Family Medicine

## 2016-03-19 ENCOUNTER — Encounter: Payer: Self-pay | Admitting: Family Medicine

## 2016-03-19 VITALS — BP 138/86 | HR 61 | Temp 97.8°F | Wt 199.6 lb

## 2016-03-19 DIAGNOSIS — E7800 Pure hypercholesterolemia, unspecified: Secondary | ICD-10-CM

## 2016-03-19 DIAGNOSIS — E119 Type 2 diabetes mellitus without complications: Secondary | ICD-10-CM | POA: Diagnosis not present

## 2016-03-19 DIAGNOSIS — R410 Disorientation, unspecified: Secondary | ICD-10-CM

## 2016-03-19 DIAGNOSIS — E785 Hyperlipidemia, unspecified: Secondary | ICD-10-CM | POA: Diagnosis not present

## 2016-03-19 MED ORDER — ROSUVASTATIN CALCIUM 20 MG PO TABS: 20.0000 mg | ORAL_TABLET | Freq: Every day | ORAL | 3 refills | Status: DC

## 2016-03-19 NOTE — Progress Notes (Signed)
Pre visit review using our clinic review tool, if applicable. No additional management support is needed unless otherwise documented below in the visit note. 

## 2016-03-19 NOTE — Patient Instructions (Signed)
Nice to see you. We are going to try Crestor to see if he can tolerate this. Please continue your metformin. Please work on diet and exercise as you have been. If you develop recurrent confusion with the Crestor please let us know. If you develop numbness, weakness, speech changes, vision changes, or any new or changing symptoms please seek medical attention immediately.

## 2016-03-19 NOTE — Assessment & Plan Note (Signed)
Well-controlled. Currently on metformin. He'll continue this. We will continue to monitor A1c's.

## 2016-03-19 NOTE — Progress Notes (Signed)
Christopher Rumps, MD Phone: [phone]  Christopher Woods. is a 60 y.o. male who presents today for new patient visit.  DIABETES Disease Monitoring: Blood Sugar ranges-not checking Polyuria/phagia/dipsia- no      Visual problems- no Medications: Compliance- taking metformin once nightly Hypoglycemic symptoms- no  HYPERLIPIDEMIA Symptoms Chest pain on exertion:  No   Leg claudication:   No Medications: Compliance- was on Lipitor for about 2 weeks, see below Right upper quadrant pain- no  Muscle aches- no  Confusion: Patient notes right after starting on the Lipitor he would have some confusion in the mornings lasting about 3-4 hours that would eventually resolve. Notes things just didn't register. He would look at a stop sign and not realize it was a stop sign right away. Notes this went on daily after starting the Lipitor. Has resolved since stopping the Lipitor. He notes no other neurological symptoms with this.    Active Ambulatory Problems    Diagnosis Date Noted  . Diabetes mellitus type 2 in nonobese ([place]) 02/26/2016  . Hypercholesterolemia 03/02/2016  . Confusion 03/19/2016   Resolved Ambulatory Problems    Diagnosis Date Noted  . Type 2 diabetes mellitus without complication ([place]) 16/60/6301  . IFG (impaired fasting glucose) 11/01/2015   Past Medical History:  Diagnosis Date  . Dupuytren's disease   . Lumbago   . MVP (mitral valve prolapse)   . Trigeminal neuralgia   . Type 2 diabetes mellitus without complication ([place]) 10/24/930    Family History  Problem Relation Age of Onset  . Hypertension Mother   . Hypertension Father   . Cancer Father     prostate  . Hypertension Sister   . Cancer Sister     melanoma  . Kidney disease Sister     Social History   Social History  . Marital status: Married    Spouse name: N/A  . Number of children: N/A  . Years of education: N/A   Occupational History  . Not on file.   Social History Main Topics  .  Smoking status: Never Smoker  . Smokeless tobacco: Never Used  . Alcohol use No  . Drug use: No  . Sexual activity: Not on file   Other Topics Concern  . Not on file   Social History Narrative  . No narrative on file    ROS  General:  Negative for nexplained weight loss, fever Skin: Negative for new or changing mole, sore that won't heal HEENT: Negative for trouble hearing, trouble seeing, ringing in ears, mouth sores, hoarseness, change in voice, dysphagia. CV:  Negative for chest pain, dyspnea, edema, palpitations Resp: Negative for cough, dyspnea, hemoptysis GI: Negative for nausea, vomiting, diarrhea, constipation, abdominal pain, melena, hematochezia. GU: Negative for dysuria, incontinence, urinary hesitance, hematuria, vaginal or penile discharge, polyuria, sexual difficulty, lumps in testicle or breasts MSK: Negative for muscle cramps or aches, positive joint pain or swelling Neuro: Negative for headaches, weakness, numbness, dizziness, passing out/fainting Psych: Negative for depression, anxiety, memory problems  Objective  Physical Exam Vitals:   03/19/16 0913  BP: 138/86  Pulse: 61  Temp: 97.8 F (36.6 C)    BP Readings from Last 3 Encounters:  03/19/16 138/86  02/26/16 (!) 146/79  11/01/15 124/72   Wt Readings from Last 3 Encounters:  03/19/16 199 lb 9.6 oz (90.5 kg)  02/26/16 203 lb (92.1 kg)  11/01/15 202 lb (91.6 kg)    Physical Exam  Constitutional: He is well-developed, well-nourished, and in no distress.  HENT:  Head: Normocephalic and atraumatic.  Mouth/Throat: Oropharynx is clear and moist.  Eyes: Pupils are equal, round, and reactive to light.  Cardiovascular: Normal rate, regular rhythm and normal heart sounds.   Pulmonary/Chest: Effort normal and breath sounds normal.  Abdominal: Soft. Bowel sounds are normal. He exhibits no distension. There is no tenderness. There is no rebound and no guarding.  Musculoskeletal: He exhibits no edema.    Neurological: He is alert.  CN 2-12 intact, 5/5 strength in bilateral biceps, triceps, grip, quads, hamstrings, plantar and dorsiflexion, sensation to light touch intact in bilateral UE and LE, normal gait, 2+ patellar reflexes  Skin: Skin is warm and dry.  Psychiatric: Mood and affect normal.     Assessment/Plan:   Diabetes mellitus type 2 in nonobese (HCC) Well-controlled. Currently on metformin. He'll continue this. We will continue to monitor A1c's.  Hypercholesterolemia Did not tolerate Lipitor. I had a discussion with him regarding his ASCVD risk score of 15.8%. Discussed that on my review most of the statins have the risk of confusion though it is less than 1% of people that get this. Advised that he may not get this with every statin. Discussed that he would have more benefit from trying another statin than risk. We will try Crestor. He'll monitor for confusion with this. He is given return precautions.  Confusion Patient with several episodes of confusion starting after starting on Lipitor. Lasted for 3-4 hours and would resolve. On review of Lipitor it appears that this is a side effect of the medication. Occurs to less than 1% of patients on this. Appears to be reversible. Patient has not had any issues since coming off of the Lipitor. Suspect likely related to Lipitor. Neurologically intact. No other neurological symptoms with this. Discussed that we would try a different statin. He will monitor for recurrence. He is given return precautions.   Orders Placed This Encounter  Procedures  . Comp Met (CMET)    Standing Status:   Future    Standing Expiration Date:   03/19/2017  . Direct LDL    Standing Status:   Future    Standing Expiration Date:   03/19/2017    Meds ordered this encounter  Medications  . rosuvastatin (CRESTOR) 20 MG tablet    Sig: Take 1 tablet (20 mg total) by mouth daily.    Dispense:  90 tablet    Refill:  [place], [place]

## 2016-03-19 NOTE — Assessment & Plan Note (Signed)
Did not tolerate Lipitor. I had a discussion with him regarding his ASCVD risk score of 15.8%. Discussed that on my review most of the statins have the risk of confusion though it is less than 1% of people that get this. Advised that he may not get this with every statin. Discussed that he would have more benefit from trying another statin than risk. We will try Crestor. He'll monitor for confusion with this. He is given return precautions.

## 2016-03-19 NOTE — Assessment & Plan Note (Signed)
Patient with several episodes of confusion starting after starting on Lipitor. Lasted for 3-4 hours and would resolve. On review of Lipitor it appears that this is a side effect of the medication. Occurs to less than 1% of patients on this. Appears to be reversible. Patient has not had any issues since coming off of the Lipitor. Suspect likely related to Lipitor. Neurologically intact. No other neurological symptoms with this. Discussed that we would try a different statin. He will monitor for recurrence. He is given return precautions.

## 2016-03-27 ENCOUNTER — Telehealth: Payer: Self-pay | Admitting: Family Medicine

## 2016-03-27 MED ORDER — BLOOD GLUCOSE MONITOR KIT: PACK | 0 refills | Status: DC

## 2016-03-27 NOTE — Telephone Encounter (Signed)
Spoke with patient and he is going to start checking BS when symptoms start. If he continues to fill like this he will call to schedule appointment.

## 2016-03-27 NOTE — Telephone Encounter (Signed)
Glucometer to be faxed in. His symptoms could be related to his Crestor. I would like to see if he can check his blood sugars when this happens to see what they are running. If they are in the normal range we may need to discontinue Crestor and reevaluate.

## 2016-03-27 NOTE — Telephone Encounter (Signed)
Please advise 

## 2016-03-27 NOTE — Telephone Encounter (Signed)
Pt wife called stating that her husband's sugar has been running around 99 - 109 when he gets up.  Around 2 pm he starts to feeling foggy headed and sluggish. Should he be testing twice a day and could this be do to the rosuvastatin (CRESTOR) 20 MG tablet. She is concerned that he might get hurt. He also needs a prescription for all the freestyle light supplies so that we can be test, he has no more left. Please advise, thank you!  Call pt @ [phone] (wife's work)

## 2016-04-02 ENCOUNTER — Telehealth: Payer: Self-pay | Admitting: Family Medicine

## 2016-04-02 NOTE — Telephone Encounter (Signed)
Please advise 

## 2016-04-02 NOTE — Telephone Encounter (Signed)
Pt wife called stating that they have figured out which medication was making him sluggish and foggy headed. They stated that it was the Crestor. He has stopped taking it on Friday and has been getting better everyday. Please advise, as to what the next step is. Thank you!  Call [facility] - [phone]

## 2016-04-03 ENCOUNTER — Ambulatory Visit: Payer: BLUE CROSS/BLUE SHIELD

## 2016-04-03 NOTE — Telephone Encounter (Signed)
Noted. This is to statins that he has been intolerant to. We could try Zetia to see if he can tolerate this. It is not a statin. I can send this in if he would like.

## 2016-04-06 NOTE — Telephone Encounter (Signed)
Pt wife called for an update as to what the next step was. Thank you!  Call [facility] @ [phone]

## 2016-04-06 NOTE — Telephone Encounter (Signed)
Spoke with the wife, she would like him to try the Zetia.  Please send to [facility] on [address]. thanks

## 2016-04-07 MED ORDER — EZETIMIBE 10 MG PO TABS: 10.0000 mg | ORAL_TABLET | Freq: Every day | ORAL | 3 refills | Status: DC

## 2016-04-07 NOTE — Telephone Encounter (Signed)
Zetia sent to the pharmacy

## 2016-04-09 ENCOUNTER — Telehealth: Payer: Self-pay | Admitting: *Deleted

## 2016-04-09 NOTE — Telephone Encounter (Signed)
LM for patient to return call.

## 2016-04-09 NOTE — Telephone Encounter (Signed)
Patient's wife stated that the Zetia Rx was to expensive, she requested another Rx or solution.  Contact Freddy Jaksch [phone]

## 2016-04-09 NOTE — Telephone Encounter (Signed)
Another option would be to try a medicine in the statin class that is not Crestor or Lipitor and see if he tolerates this. The last option would be referral to cardiology to consider an injectable medication for cholesterol.

## 2016-04-09 NOTE — Telephone Encounter (Signed)
Please advise 

## 2016-04-10 NOTE — Telephone Encounter (Signed)
Patients wife is aware of Dr. Ellen Henri statement , and will call back after thinking it over. She did request a call for advise.  Please call  Freddy Jaksch [phone]

## 2016-04-10 NOTE — Telephone Encounter (Signed)
LM for patient to call the office to schedule a follow up appointment to discuss the medication.

## 2016-05-01 ENCOUNTER — Other Ambulatory Visit (INDEPENDENT_AMBULATORY_CARE_PROVIDER_SITE_OTHER): Payer: BLUE CROSS/BLUE SHIELD

## 2016-05-01 DIAGNOSIS — E785 Hyperlipidemia, unspecified: Secondary | ICD-10-CM

## 2016-05-01 LAB — COMPREHENSIVE METABOLIC PANEL
ALT: 14 U/L (ref 0–53)
AST: 20 U/L (ref 0–37)
Albumin: 4.3 g/dL (ref 3.5–5.2)
Alkaline Phosphatase: 48 U/L (ref 39–117)
BUN: 17 mg/dL (ref 6–23)
CO2: 32 mEq/L (ref 19–32)
Calcium: 9.5 mg/dL (ref 8.4–10.5)
Chloride: 104 mEq/L (ref 96–112)
Creatinine, Ser: 0.94 mg/dL (ref 0.40–1.50)
GFR: 87.28 mL/min (ref >60.00)
Glucose, Bld: 137 mg/dL — ABNORMAL HIGH (ref 70–99)
Potassium: 4.5 mEq/L (ref 3.5–5.1)
Sodium: 141 mEq/L (ref 135–145)
Total Bilirubin: 0.5 mg/dL (ref 0.2–1.2)
Total Protein: 7.3 g/dL (ref 6.0–8.3)

## 2016-05-01 LAB — LDL CHOLESTEROL, DIRECT: Direct LDL: 114 mg/dL

## 2016-06-23 ENCOUNTER — Encounter: Payer: Self-pay | Admitting: Family Medicine

## 2016-06-23 ENCOUNTER — Ambulatory Visit (INDEPENDENT_AMBULATORY_CARE_PROVIDER_SITE_OTHER): Payer: BLUE CROSS/BLUE SHIELD | Admitting: Family Medicine

## 2016-06-23 VITALS — BP 118/74 | HR 64 | Temp 97.8°F | Wt 202.4 lb

## 2016-06-23 DIAGNOSIS — E119 Type 2 diabetes mellitus without complications: Secondary | ICD-10-CM

## 2016-06-23 DIAGNOSIS — E785 Hyperlipidemia, unspecified: Secondary | ICD-10-CM | POA: Diagnosis not present

## 2016-06-23 DIAGNOSIS — E7800 Pure hypercholesterolemia, unspecified: Secondary | ICD-10-CM | POA: Diagnosis not present

## 2016-06-23 DIAGNOSIS — T148XXA Other injury of unspecified body region, initial encounter: Secondary | ICD-10-CM

## 2016-06-23 DIAGNOSIS — E1169 Type 2 diabetes mellitus with other specified complication: Secondary | ICD-10-CM

## 2016-06-23 LAB — COMPREHENSIVE METABOLIC PANEL
ALT: 13 U/L (ref 0–53)
AST: 18 U/L (ref 0–37)
Albumin: 4.3 g/dL (ref 3.5–5.2)
Alkaline Phosphatase: 55 U/L (ref 39–117)
BUN: 17 mg/dL (ref 6–23)
CO2: 33 mEq/L — ABNORMAL HIGH (ref 19–32)
Calcium: 9.5 mg/dL (ref 8.4–10.5)
Chloride: 104 mEq/L (ref 96–112)
Creatinine, Ser: 0.95 mg/dL (ref 0.40–1.50)
GFR: 86.18 mL/min (ref >60.00)
Glucose, Bld: 128 mg/dL — ABNORMAL HIGH (ref 70–99)
Potassium: 4.5 mEq/L (ref 3.5–5.1)
Sodium: 139 mEq/L (ref 135–145)
Total Bilirubin: 0.6 mg/dL (ref 0.2–1.2)
Total Protein: 6.9 g/dL (ref 6.0–8.3)

## 2016-06-23 LAB — LDL CHOLESTEROL, DIRECT: Direct LDL: 116 mg/dL

## 2016-06-23 LAB — HEMOGLOBIN A1C: Hgb A1c MFr Bld: 6.3 % (ref 4.6–6.5)

## 2016-06-23 NOTE — Assessment & Plan Note (Signed)
Likely abdominal wall muscle strain. Patient does live is a gallbladder though symptoms not overly consistent with this and has a benign exam today. We will check a CMP today to evaluate LFTs. He'll continue to monitor.

## 2016-06-23 NOTE — Assessment & Plan Note (Signed)
Appears to be well-controlled. Check A1c today. Continue metformin.

## 2016-06-23 NOTE — Assessment & Plan Note (Signed)
Tolerating Zetia. Check direct LDL and CMP.

## 2016-06-23 NOTE — Patient Instructions (Signed)
Nice to see you. Please continue your current medications. We'll obtain some lab work today and contact you with the results. If the muscle soreness does not improve please let us know.

## 2016-06-23 NOTE — Progress Notes (Signed)
  [name], MD Phone: [phone]  Christopher Woods. is a 61 y.o. male who presents today for f/u.  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 He notes it does not radiate. No nausea, vomiting, or diarrhea. Has had this similarly in the past after working on cars. His been going on for the last couple of weeks. No pain today.   PMH: nonsmoker.   ROS see history of present illness  Objective  Physical Exam Vitals:   06/23/16 0810  BP: 118/74  Pulse: 64  Temp: 97.8 F (36.6 C)    BP Readings from Last 3 Encounters:  06/23/16 118/74  03/19/16 138/86  02/26/16 (!) 146/79   Wt Readings from Last 3 Encounters:  06/23/16 202 lb 6.4 oz (91.8 kg)  03/19/16 199 lb 9.6 oz (90.5 kg)  02/26/16 203 lb (92.1 kg)    Physical Exam  Constitutional: No distress.  HENT:  Head: Normocephalic and atraumatic.  Cardiovascular: Normal rate, regular rhythm and normal heart sounds.   Pulmonary/Chest: Effort normal and breath sounds normal.  Abdominal: Soft. Bowel sounds are normal. He exhibits no distension. There is no tenderness. There is no rebound and no guarding.  Musculoskeletal: He exhibits no edema.  Neurological: He is alert. Gait normal.  Skin: He is not diaphoretic.     Assessment/Plan: Please see individual problem list.  Diabetes mellitus type 2 in nonobese ([place]) Appears to be well-controlled. Check A1c today. Continue metformin.  Hypercholesterolemia Tolerating Zetia. Check direct LDL and CMP.  Muscle strain Likely abdominal wall muscle  strain. Patient does live is a gallbladder though symptoms not overly consistent with this and has a benign exam today. We will check a CMP today to evaluate LFTs. He'll continue to monitor.   Orders Placed This Encounter  Procedures  . HgB A1c  . Direct LDL  . Comp Met (CMET)    [name], MD [place]

## 2016-06-25 ENCOUNTER — Telehealth: Payer: Self-pay | Admitting: *Deleted

## 2016-06-25 NOTE — Telephone Encounter (Signed)
Pt's wife requested lab results  Contact Freddy Jaksch [phone]

## 2016-06-25 NOTE — Telephone Encounter (Signed)
See result note.  

## 2016-08-05 DIAGNOSIS — M25531 Pain in right wrist: Secondary | ICD-10-CM | POA: Diagnosis not present

## 2016-09-02 DIAGNOSIS — M25531 Pain in right wrist: Secondary | ICD-10-CM | POA: Diagnosis not present

## 2016-09-02 DIAGNOSIS — S63094D Other dislocation of right wrist and hand, subsequent encounter: Secondary | ICD-10-CM | POA: Diagnosis not present

## 2016-09-02 DIAGNOSIS — S6982XD Other specified injuries of left wrist, hand and finger(s), subsequent encounter: Secondary | ICD-10-CM | POA: Diagnosis not present

## 2016-11-16 DIAGNOSIS — L820 Inflamed seborrheic keratosis: Secondary | ICD-10-CM | POA: Diagnosis not present

## 2016-11-16 DIAGNOSIS — D2239 Melanocytic nevi of other parts of face: Secondary | ICD-10-CM | POA: Diagnosis not present

## 2016-12-17 ENCOUNTER — Other Ambulatory Visit: Payer: Self-pay | Admitting: Family Medicine

## 2016-12-17 MED ORDER — METFORMIN HCL 500 MG PO TABS: 500.0000 mg | ORAL_TABLET | Freq: Every day | ORAL | 1 refills | Status: DC

## 2016-12-21 ENCOUNTER — Ambulatory Visit (INDEPENDENT_AMBULATORY_CARE_PROVIDER_SITE_OTHER): Payer: BLUE CROSS/BLUE SHIELD | Admitting: Family Medicine

## 2016-12-21 ENCOUNTER — Encounter: Payer: Self-pay | Admitting: Family Medicine

## 2016-12-21 VITALS — BP 140/90 | HR 63 | Temp 98.0°F | Wt 204.6 lb

## 2016-12-21 DIAGNOSIS — I8393 Asymptomatic varicose veins of bilateral lower extremities: Secondary | ICD-10-CM | POA: Insufficient documentation

## 2016-12-21 DIAGNOSIS — E119 Type 2 diabetes mellitus without complications: Secondary | ICD-10-CM

## 2016-12-21 DIAGNOSIS — E7800 Pure hypercholesterolemia, unspecified: Secondary | ICD-10-CM | POA: Diagnosis not present

## 2016-12-21 DIAGNOSIS — R030 Elevated blood-pressure reading, without diagnosis of hypertension: Secondary | ICD-10-CM

## 2016-12-21 DIAGNOSIS — N5089 Other specified disorders of the male genital organs: Secondary | ICD-10-CM

## 2016-12-21 LAB — HEMOGLOBIN A1C: Hgb A1c MFr Bld: 6.3 % (ref 4.6–6.5)

## 2016-12-21 LAB — LDL CHOLESTEROL, DIRECT: Direct LDL: 155 mg/dL

## 2016-12-21 NOTE — Assessment & Plan Note (Signed)
Check A1c.  Continue metformin. 

## 2016-12-21 NOTE — Assessment & Plan Note (Signed)
Elevated today. No prior history of hypertension. He'll start checking it consistently at home. He'll return in 1-2 weeks for nurse blood pressure check and bring his readings and at that time.

## 2016-12-21 NOTE — Assessment & Plan Note (Addendum)
Asymptomatic. He will continue to monitor. Given return precautions.

## 2016-12-21 NOTE — Progress Notes (Signed)
[name], MD Phone: [phone]  Christopher Woods. is a 61 y.o. male who presents today for follow-up.  Diabetes: Occasionally checks. Typically around 120. Taking metformin. No polyuria or polydipsia. Rare hypoglycemic symptoms where he feels dizzy and sweaty. Results with eating. Due to see the eye doctor.  Hyperlipidemia: Has been intolerant of statins. Zetia was not beneficial for his cholesterol. He is quite active at his job. He walks a lot as well. Diet is not the best. No sodas or sweet tea. Does eat hamburgers, eggs, sausage, and peanut butter sandwiches. Some vegetables. No chest pain, shortness breath, or claudication.  No history of elevated blood pressure. No medications previously. Notes it's always good when he checks it at home.  Does note varicose veins in bilateral legs left greater than right. They do not bother him. He does occasionally wear compression stockings. No swelling.  Patient notes about a month ago he noticed his left testicle was a little larger than the right. Notes it is hanging a little lower as well. He notes no injury. No pain. Notes this is a little different than prior. He just wants to check and make sure everything looks okay. No discharge or dysuria.  PMH: nonsmoker.   ROS see history of present illness  Objective  Physical Exam Vitals:   12/21/16 0818  BP: 140/90  Pulse: 63  Temp: 98 F (36.7 C)    BP Readings from Last 3 Encounters:  12/21/16 140/90  06/23/16 118/74  03/19/16 138/86   Wt Readings from Last 3 Encounters:  12/21/16 204 lb 9.6 oz (92.8 kg)  06/23/16 202 lb 6.4 oz (91.8 kg)  03/19/16 199 lb 9.6 oz (90.5 kg)    Physical Exam  Constitutional: No distress.  Cardiovascular: Normal rate, regular rhythm and normal heart sounds.   Pulmonary/Chest: Effort normal and breath sounds normal.  Genitourinary:  Genitourinary Comments: Normal penis, normal scrotum, left testicle hangs slightly lower than right  testicle, slightly larger than right testicle as well, no nodules or masses noted, normal epididymis, normal vas deferens, no inguinal hernias  Musculoskeletal: He exhibits no edema.  Varicose veins noted left lower leg greater than right lower leg, nontender, no warmth  Neurological: He is alert. Gait normal.  Skin: Skin is warm and dry. He is not diaphoretic.     Assessment/Plan: Please see individual problem list.  Diabetes mellitus type 2 in nonobese (HCC) Check A1c. Continue metformin.  Hypercholesterolemia Encourage diet and exercise. Encouraged taking the fish oil. Check LDL.  Left testicle enlargement Left testicle slightly larger than right and also slightly lower hanging. Testicles oriented in the normal direction bilaterally. There is no discomfort on exam. Discussed that this is likely normal though given that it is a change from previously we'll obtain an ultrasound. Given return precautions.  Varicose veins of both lower extremities Asymptomatic. He will continue to monitor. Given return precautions.  Elevated BP without diagnosis of hypertension Elevated today. No prior history of hypertension. He'll start checking it consistently at home. He'll return in 1-2 weeks for nurse blood pressure check and bring his readings and at that time.   Orders Placed This Encounter  Procedures  . [place] Art/Ven Flow Abd Pelv Doppler    Standing Status:   Future    Standing Expiration Date:   02/21/2018    Order Specific Question:   Reason for Exam (SYMPTOM  OR DIAGNOSIS REQUIRED)    Answer:   left testicular change, slight enlargement, one month  Order Specific Question:   Preferred imaging location?    Answer:   [facility]  . US Scrotum    Standing Status:   Future    Standing Expiration Date:   02/21/2018    Order Specific Question:   Reason for Exam (SYMPTOM  OR DIAGNOSIS REQUIRED)    Answer:   left testicular change, slight enlargement, one month    Order Specific  Question:   Preferred imaging location?    Answer:   [facility]  . Direct LDL  . HgB A1c   [name], MD [place]

## 2016-12-21 NOTE — Assessment & Plan Note (Signed)
Left testicle slightly larger than right and also slightly lower hanging. Testicles oriented in the normal direction bilaterally. There is no discomfort on exam. Discussed that this is likely normal though given that it is a change from previously we'll obtain an ultrasound. Given return precautions.

## 2016-12-21 NOTE — Assessment & Plan Note (Addendum)
Encourage diet and exercise. Encouraged taking the fish oil. Check LDL.

## 2016-12-21 NOTE — Patient Instructions (Signed)
Nice to see you. These start working on diet and exercise. I have included some dietary instructions below. We will check lab work today and contact you with the results. We'll get an ultrasound of the testicles. If you develop pain in your testicles please be evaluated. Please refer the compression stockings as previously advised for your varicose veins. If they change bother him any manner please let us know.

## 2016-12-22 ENCOUNTER — Other Ambulatory Visit: Payer: Self-pay | Admitting: Family Medicine

## 2016-12-22 DIAGNOSIS — E785 Hyperlipidemia, unspecified: Secondary | ICD-10-CM

## 2016-12-25 ENCOUNTER — Ambulatory Visit: Payer: BLUE CROSS/BLUE SHIELD

## 2016-12-25 ENCOUNTER — Ambulatory Visit
Admission: RE | Admit: 2016-12-25 | Discharge: 2016-12-25 | Disposition: A | Discharge status: Home / Self Care | Payer: BLUE CROSS/BLUE SHIELD | Source: Ambulatory Visit | Attending: Family Medicine | Admitting: Family Medicine

## 2016-12-25 DIAGNOSIS — N5089 Other specified disorders of the male genital organs: Secondary | ICD-10-CM | POA: Insufficient documentation

## 2016-12-25 DIAGNOSIS — N50819 Testicular pain, unspecified: Secondary | ICD-10-CM | POA: Diagnosis not present

## 2016-12-25 DIAGNOSIS — I861 Scrotal varices: Secondary | ICD-10-CM | POA: Insufficient documentation

## 2017-02-15 ENCOUNTER — Telehealth: Payer: Self-pay | Admitting: *Deleted

## 2017-02-15 NOTE — Telephone Encounter (Signed)
Pt 's wife has requested pt be seen for his physical before the end of October  Please give a time and date  [place]

## 2017-02-15 NOTE — Telephone Encounter (Signed)
Patient scheduled.

## 2017-03-01 ENCOUNTER — Encounter: Payer: BLUE CROSS/BLUE SHIELD | Admitting: Family Medicine

## 2017-03-02 ENCOUNTER — Encounter: Payer: BLUE CROSS/BLUE SHIELD | Admitting: Family Medicine

## 2017-03-08 ENCOUNTER — Ambulatory Visit: Payer: BLUE CROSS/BLUE SHIELD | Admitting: Cardiovascular Disease

## 2017-04-02 DIAGNOSIS — M76892 Other specified enthesopathies of left lower limb, excluding foot: Secondary | ICD-10-CM | POA: Diagnosis not present

## 2017-04-02 DIAGNOSIS — M160 Bilateral primary osteoarthritis of hip: Secondary | ICD-10-CM | POA: Diagnosis not present

## 2017-04-10 NOTE — Progress Notes (Signed)
Cardiology Office Note  Date:  04/12/2017   ID:  Christopher Woods., DOB [birth year], MRN [id number]  PCP:  Leone Haven, MD   Chief Complaint  Patient presents with  . Other     New patient. Per Dr. Caryl Bis for Hyperlipidemia. Patient denies chest pain and SOB. Meds reviewed verbally with patient.     HPI:  Christopher Woods is a 61 year old gentleman with past medical history of Hyperlipidemia Type 2 diabetes, HBA1C 6.3 Varicose veins Elevated blood pressure Who presents by referral from Dr. Caryl Bis for consultation of his cardiac risk factors  No prior smoking history No significant family history of coronary disease  He has tried Crestor 20 mg in the past and felt fuzzy headed Try Lipitor 40 mg daily also thinks he felt funny, fuzzy headed No problem on zetia, but thought maybe it was not helping, was only on it for a short period of time  Denies any shortness of breath or chest pain on exertion Recently having left hip pain radiating down his leg Worse after moving mattresses this past Saturday Presents in a wheelchair secondary to pain down the leg, sharp, stabbing  pain running below the knee from the buttock area  Previously seen by [facility] orthopedics for his hands  EKG personally reviewed by myself on todays visit Shows normal sinus rhythm rate 76 bpm with no significant ST or T wave changes   PMH:   has a past medical history of Dupuytren's disease, Lumbago, MVP (mitral valve prolapse), Trigeminal neuralgia, and Type 2 diabetes mellitus without complication ([place]) (07/08/863).  PSH:    Past Surgical History:  Procedure Laterality Date  . HAND SURGERY Right    thumb reconstruction  . HIP SURGERY Left   . HIP SURGERY Right   . KNEE SURGERY Left   . KNEE SURGERY Right    meniscal tear  . TRIGGER FINGER RELEASE Left     Current Outpatient Medications  Medication Sig Dispense Refill  . Ascorbic Acid (VITAMIN C) 1000 MG tablet Take 1,000 mg by  mouth daily.    Marland Kitchen aspirin EC 81 MG tablet Take 81 mg by mouth daily.    . blood glucose meter kit and supplies KIT Dispense freestyle light. Use two times daily as directed. (FOR ICD-9 250.00, 250.01). 1 each 0  . metFORMIN (GLUCOPHAGE) 500 MG tablet Take 1 tablet (500 mg total) by mouth at bedtime. 90 tablet 1  . Multiple Vitamin (MULTIVITAMIN) tablet Take 1 tablet by mouth daily.    . Omega-3 Fatty Acids (FISH OIL) 1000 MG CAPS Take 1,000 mg daily by mouth.    . rosuvastatin (CRESTOR) 5 MG tablet Take 1 tablet (5 mg total) daily by mouth. 30 tablet 11   No current facility-administered medications for this visit.      Allergies:   Indomethacin   Social History:  The patient  reports that  has never smoked. he has never used smokeless tobacco. He reports that he does not drink alcohol or use drugs.   Family History:   family history includes Cancer in his father and sister; Hypertension in his father, mother, and sister; Kidney disease in his sister.    Review of Systems: Review of Systems  Constitutional: Negative.   Respiratory: Negative.   Cardiovascular: Negative.   Gastrointestinal: Negative.   Musculoskeletal: Positive for joint pain.       Pain radiating down left leg  Neurological: Negative.   Psychiatric/Behavioral: Negative.   All other systems reviewed and  are negative.    PHYSICAL EXAM: VS:  BP (!) 156/81 (BP Location: Right Arm, Patient Position: Sitting, Cuff Size: Large)   Pulse 76   Wt 195 lb 8 oz (88.7 kg)   BMI 26.08 kg/m  , BMI Body mass index is 26.08 kg/m. GEN: Well nourished, well developed, in no acute distress, presenting in a wheelchair secondary to severe leg pain HEENT: normal  Neck: no JVD, carotid bruits, or masses Cardiac: RRR; no murmurs, rubs, or gallops,no edema  Respiratory:  clear to auscultation bilaterally, normal work of breathing GI: soft, nontender, nondistended, + BS MS: no deformity or atrophy  Skin: warm and dry, no  rash Neuro:  Strength and sensation are intact Psych: euthymic mood, full affect    Recent Labs: 06/23/2016: ALT 13; BUN 17; Creatinine, Ser 0.95; Potassium 4.5; Sodium 139    Lipid Panel Lab Results  Component Value Date   CHOL 221 (H) 02/26/2016   HDL 55 02/26/2016   LDLCALC 155 (H) 02/26/2016   TRIG 57 02/26/2016      Wt Readings from Last 3 Encounters:  04/12/17 195 lb 8 oz (88.7 kg)  12/21/16 204 lb 9.6 oz (92.8 kg)  06/23/16 202 lb 6.4 oz (91.8 kg)       ASSESSMENT AND PLAN:  Diabetes mellitus type 2 in nonobese [facility]) - Plan: CT CARDIAC SCORING We have encouraged continued exercise, careful diet management in an effort to lose weight.  Hypercholesterolemia - Plan: EKG 12-Lead, CT CARDIAC SCORING Risk factors including hyperlipidemia, diabetes CT coronary calcium scoring ordered Recommended he retry Crestor 5 mg every other day After 1 month to increase up to 5 mg daily If numbers not at goal could add Zetia  Elevated BP without diagnosis of hypertension Blood pressure mildly elevated, recommended close monitoring of blood pressure at home Will call us with blood pressure numbers  Left leg pain Recommended he see [facility] orthopedics, urgent care given his worsening left leg pain Reports having baseline moderate pain,  worse on Saturday after moving mattresses Recommended icing, Aleve  Disposition:   F/U as needed  Patient seen in consultation for Dr. Caryl Bis and will be referred back to his office for ongoing care of the issues detailed above   Total encounter time more than 60 minutes  Greater than 50% was spent in counseling and coordination of care with the patient    Orders Placed This Encounter  Procedures  . CT CARDIAC SCORING  . EKG 12-Lead     Signed, Esmond Plants, M.D., Ph.D. 04/12/2017  [place], [place]

## 2017-04-12 ENCOUNTER — Encounter: Payer: Self-pay | Admitting: Cardiovascular Disease

## 2017-04-12 ENCOUNTER — Ambulatory Visit: Payer: BLUE CROSS/BLUE SHIELD | Admitting: Cardiovascular Disease

## 2017-04-12 VITALS — BP 156/81 | HR 76 | Wt 195.5 lb

## 2017-04-12 DIAGNOSIS — E7800 Pure hypercholesterolemia, unspecified: Secondary | ICD-10-CM | POA: Diagnosis not present

## 2017-04-12 DIAGNOSIS — M545 Low back pain: Secondary | ICD-10-CM | POA: Diagnosis not present

## 2017-04-12 DIAGNOSIS — M5416 Radiculopathy, lumbar region: Secondary | ICD-10-CM | POA: Diagnosis not present

## 2017-04-12 DIAGNOSIS — R030 Elevated blood-pressure reading, without diagnosis of hypertension: Secondary | ICD-10-CM

## 2017-04-12 DIAGNOSIS — M79605 Pain in left leg: Secondary | ICD-10-CM | POA: Diagnosis not present

## 2017-04-12 DIAGNOSIS — E119 Type 2 diabetes mellitus without complications: Secondary | ICD-10-CM | POA: Diagnosis not present

## 2017-04-12 DIAGNOSIS — M5136 Other intervertebral disc degeneration, lumbar region: Secondary | ICD-10-CM | POA: Diagnosis not present

## 2017-04-12 DIAGNOSIS — M76892 Other specified enthesopathies of left lower limb, excluding foot: Secondary | ICD-10-CM | POA: Diagnosis not present

## 2017-04-12 DIAGNOSIS — M47816 Spondylosis without myelopathy or radiculopathy, lumbar region: Secondary | ICD-10-CM | POA: Diagnosis not present

## 2017-04-12 MED ORDER — ROSUVASTATIN CALCIUM 5 MG PO TABS: 5.0000 mg | ORAL_TABLET | Freq: Every day | ORAL | 11 refills | Status: DC

## 2017-04-12 NOTE — Patient Instructions (Addendum)
Medication Instructions:   Try crestor 5 every other day For a month Then up a daily if tolerated  Labwork:  No new labs needed  Testing/Procedures:  CT coronary calcium score for hyperlipidemia and diabetes [id number] $150.00  [address] [place], [place] [address]   Follow-Up: It was a pleasure seeing you in the office today. Please call us if you have new issues that need to be addressed before your next appt.  [phone]  Your physician wants you to follow-up in:  As needed  If you need a refill on your cardiac medications before your next appointment, please call your pharmacy.

## 2017-04-14 DIAGNOSIS — M5137 Other intervertebral disc degeneration, lumbosacral region: Secondary | ICD-10-CM | POA: Diagnosis not present

## 2017-04-14 DIAGNOSIS — M5442 Lumbago with sciatica, left side: Secondary | ICD-10-CM | POA: Diagnosis not present

## 2017-04-20 DIAGNOSIS — M5442 Lumbago with sciatica, left side: Secondary | ICD-10-CM | POA: Diagnosis not present

## 2017-04-20 DIAGNOSIS — M5137 Other intervertebral disc degeneration, lumbosacral region: Secondary | ICD-10-CM | POA: Diagnosis not present

## 2017-04-24 DIAGNOSIS — M5442 Lumbago with sciatica, left side: Secondary | ICD-10-CM | POA: Diagnosis not present

## 2017-04-24 DIAGNOSIS — M5137 Other intervertebral disc degeneration, lumbosacral region: Secondary | ICD-10-CM | POA: Diagnosis not present

## 2017-04-26 ENCOUNTER — Inpatient Hospital Stay: Admission: RE | Admit: 2017-04-26 | Payer: BLUE CROSS/BLUE SHIELD | Source: Ambulatory Visit

## 2017-04-26 ENCOUNTER — Telehealth: Payer: Self-pay | Admitting: Cardiovascular Disease

## 2017-04-26 NOTE — Telephone Encounter (Signed)
Spoke with patients wife per release form and reviewed plan of care. She states they are scheduled to have CT Calcium scoring on 05/17/17 and advised that we would call with results and further recommendations. Also discussed that due to Christmas there may be a delay in [place] calling with those results but that we would be call once results are available. She verbalized understanding of our conversation and had no further questions at this time.

## 2017-04-26 NOTE — Telephone Encounter (Signed)
Patient wife calling Would like to re confirm information from last visit PT has just started with the CRESTOR medication and will be having a CT calcium score later this month Curious on what they will need to do before and after and how will they receive results Please call to advise

## 2017-05-04 DIAGNOSIS — M545 Low back pain: Secondary | ICD-10-CM | POA: Diagnosis not present

## 2017-05-06 DIAGNOSIS — M545 Low back pain: Secondary | ICD-10-CM | POA: Diagnosis not present

## 2017-05-07 ENCOUNTER — Telehealth: Payer: Self-pay | Admitting: Family Medicine

## 2017-05-07 DIAGNOSIS — M5137 Other intervertebral disc degeneration, lumbosacral region: Secondary | ICD-10-CM | POA: Diagnosis not present

## 2017-05-07 DIAGNOSIS — M5442 Lumbago with sciatica, left side: Secondary | ICD-10-CM | POA: Diagnosis not present

## 2017-05-07 DIAGNOSIS — M5126 Other intervertebral disc displacement, lumbar region: Secondary | ICD-10-CM | POA: Diagnosis not present

## 2017-05-07 NOTE — Telephone Encounter (Signed)
Pt dropped off Surgical clearance form to be filled out. Dr. Rolena Infante is wanting this back ASAP he is wanting pt to have surgery before the end of the year. His office notes from today are attached  Please fax to Derl Barrow at [phone]. Placed in Dr Caryl Bis color folder upfront

## 2017-05-10 NOTE — Telephone Encounter (Signed)
On your desk

## 2017-05-10 NOTE — Telephone Encounter (Signed)
Patient needs a preop visit.  You can place him in at 4:30 on a day that works for him.  Thanks.

## 2017-05-10 NOTE — Telephone Encounter (Signed)
Scheduled for Thursday at 11.

## 2017-05-13 ENCOUNTER — Encounter: Payer: Self-pay | Admitting: Family Medicine

## 2017-05-13 ENCOUNTER — Ambulatory Visit: Payer: BLUE CROSS/BLUE SHIELD | Admitting: Family Medicine

## 2017-05-13 ENCOUNTER — Other Ambulatory Visit: Payer: Self-pay

## 2017-05-13 VITALS — BP 134/80 | HR 68 | Temp 98.3°F | Wt 200.6 lb

## 2017-05-13 DIAGNOSIS — M21372 Foot drop, left foot: Secondary | ICD-10-CM | POA: Diagnosis not present

## 2017-05-13 DIAGNOSIS — E7800 Pure hypercholesterolemia, unspecified: Secondary | ICD-10-CM

## 2017-05-13 DIAGNOSIS — E119 Type 2 diabetes mellitus without complications: Secondary | ICD-10-CM

## 2017-05-13 DIAGNOSIS — Z01818 Encounter for other preprocedural examination: Secondary | ICD-10-CM | POA: Diagnosis not present

## 2017-05-13 DIAGNOSIS — E785 Hyperlipidemia, unspecified: Secondary | ICD-10-CM | POA: Diagnosis not present

## 2017-05-13 LAB — CBC
HCT: 45.4 % (ref 39.0–52.0)
Hemoglobin: 14.9 g/dL (ref 13.0–17.0)
MCHC: 32.8 g/dL (ref 30.0–36.0)
MCV: 94.9 fl (ref 78.0–100.0)
Platelets: 255 10*3/uL (ref 150.0–400.0)
RBC: 4.79 Mil/uL (ref 4.22–5.81)
RDW: 12.7 % (ref 11.5–15.5)
WBC: 5 10*3/uL (ref 4.0–10.5)

## 2017-05-13 LAB — COMPREHENSIVE METABOLIC PANEL
ALT: 13 U/L (ref 0–53)
AST: 17 U/L (ref 0–37)
Albumin: 4.3 g/dL (ref 3.5–5.2)
Alkaline Phosphatase: 53 U/L (ref 39–117)
BUN: 16 mg/dL (ref 6–23)
CO2: 34 mEq/L — ABNORMAL HIGH (ref 19–32)
Calcium: 9.9 mg/dL (ref 8.4–10.5)
Chloride: 101 mEq/L (ref 96–112)
Creatinine, Ser: 0.98 mg/dL (ref 0.40–1.50)
GFR: 82.89 mL/min (ref >60.00)
Glucose, Bld: 142 mg/dL — ABNORMAL HIGH (ref 70–99)
Potassium: 4.1 mEq/L (ref 3.5–5.1)
Sodium: 141 mEq/L (ref 135–145)
Total Bilirubin: 0.5 mg/dL (ref 0.2–1.2)
Total Protein: 7.5 g/dL (ref 6.0–8.3)

## 2017-05-13 LAB — LDL CHOLESTEROL, DIRECT: Direct LDL: 114 mg/dL

## 2017-05-13 LAB — HEMOGLOBIN A1C: Hgb A1c MFr Bld: 6.5 % (ref 4.6–6.5)

## 2017-05-13 NOTE — Progress Notes (Signed)
  Christopher Rumps, MD Phone: [phone]  [name] [name]. is a 61 y.o. male who presents today for follow-up.  Patient presents for surgical clearance.  He is going to have an L4/5 discectomy on the left.  He notes tingling and numbness in his left leg from this.  Does feel like his leg is weak.  This has been relatively stable.  No incontinence or saddle anesthesia.  He notes no chest pain or breathlessness when climbing 2 flights of stairs.  No kidney disease.  No family history of anesthesia issues or personal history of anesthesia issues.  No history of heart attack.  No history of irregular heartbeat.  No history of stroke.  No history of seizures.  No history of thyroid disease.  No history of liver disease.  No history of heart failure.  No history of asthma or bronchitis.  He does have diabetes and only takes 1 metformin.  A1c's have been relatively well controlled.  He currently takes Crestor for his cholesterol.  He has been tolerating a low dose of this.  Social History   Tobacco Use  Smoking Status Never Smoker  Smokeless Tobacco Never Used     ROS see history of present illness  Objective  Physical Exam Vitals:   05/13/17 1040  BP: 134/80  Pulse: 68  Temp: 98.3 F (36.8 C)  SpO2: 98%    BP Readings from Last 3 Encounters:  05/13/17 134/80  04/12/17 (!) 156/81  12/21/16 140/90   Wt Readings from Last 3 Encounters:  05/13/17 200 lb 9.6 oz (91 kg)  04/12/17 195 lb 8 oz (88.7 kg)  12/21/16 204 lb 9.6 oz (92.8 kg)    Physical Exam  Constitutional: No distress.  Cardiovascular: Normal rate, regular rhythm and normal heart sounds.  Pulmonary/Chest: Effort normal and breath sounds normal.  Abdominal: Soft. Bowel sounds are normal. He exhibits no distension. There is no tenderness. There is no rebound and no guarding.  Musculoskeletal: He exhibits no edema.  Neurological: He is alert. Gait normal.  Skin: Skin is warm and dry. He is not diaphoretic.      Assessment/Plan: Please see individual problem list.  Preop examination Patient presents for preoperative exam.  Overall he is doing well.  His Gupta perioperative ocular risk percentage is 0.1% placing him at low risk for cardiovascular event.  He is low risk for medical complications as well with exception of infection following surgery which he is at average risk for.  We will check lab work as outlined below.  If this is acceptable we will provide clearance.  Hypercholesterolemia Patient has been able to tolerate a low dose of Crestor.  We will recheck his LDL today.  Diabetes mellitus type 2 in nonobese [facility]) Patient is due for A1c check.  Continue metformin.   [name] was seen today for surgical clearance.  Diagnoses and all orders for this visit:  Diabetes mellitus type 2 in nonobese (HCC) -     HgB A1c  Preop examination -     CBC -     Comp Met (CMET)  Hyperlipidemia, unspecified hyperlipidemia type -     Direct LDL  Hypercholesterolemia    Orders Placed This Encounter  Procedures  . HgB A1c  . CBC  . Comp Met (CMET)  . Direct LDL    No orders of the defined types were placed in this encounter.    Christopher Rumps, MD [place]

## 2017-05-13 NOTE — Assessment & Plan Note (Signed)
Patient is due for A1c check.  Continue metformin.

## 2017-05-13 NOTE — Assessment & Plan Note (Signed)
Patient presents for preoperative exam.  Overall he is doing well.  His Gupta perioperative ocular risk percentage is 0.1% placing him at low risk for cardiovascular event.  He is low risk for medical complications as well with exception of infection following surgery which he is at average risk for.  We will check lab work as outlined below.  If this is acceptable we will provide clearance.

## 2017-05-13 NOTE — Patient Instructions (Signed)
Nice to see you. We will check lab work today prior to your surgery and contact you with the results.   We will get your surgical clearance faxed as well.

## 2017-05-13 NOTE — Assessment & Plan Note (Signed)
Patient has been able to tolerate a low dose of Crestor.  We will recheck his LDL today.

## 2017-05-15 ENCOUNTER — Other Ambulatory Visit: Payer: Self-pay | Admitting: Family Medicine

## 2017-05-15 DIAGNOSIS — E785 Hyperlipidemia, unspecified: Secondary | ICD-10-CM

## 2017-05-15 MED ORDER — ROSUVASTATIN CALCIUM 10 MG PO TABS: 10.0000 mg | ORAL_TABLET | Freq: Every day | ORAL | 1 refills | Status: DC

## 2017-05-17 ENCOUNTER — Ambulatory Visit (INDEPENDENT_AMBULATORY_CARE_PROVIDER_SITE_OTHER)
Admission: RE | Admit: 2017-05-17 | Discharge: 2017-05-17 | Disposition: A | Discharge status: Home / Self Care | Payer: Self-pay | Source: Ambulatory Visit | Attending: Cardiovascular Disease | Admitting: Cardiovascular Disease

## 2017-05-17 DIAGNOSIS — E7800 Pure hypercholesterolemia, unspecified: Secondary | ICD-10-CM

## 2017-05-17 DIAGNOSIS — E119 Type 2 diabetes mellitus without complications: Secondary | ICD-10-CM

## 2017-05-19 ENCOUNTER — Encounter: Payer: BLUE CROSS/BLUE SHIELD | Admitting: Family Medicine

## 2017-05-24 ENCOUNTER — Other Ambulatory Visit: Payer: Self-pay

## 2017-05-24 MED ORDER — EZETIMIBE 10 MG PO TABS: 10.0000 mg | ORAL_TABLET | Freq: Every day | ORAL | 3 refills | Status: DC

## 2017-06-04 DIAGNOSIS — M5442 Lumbago with sciatica, left side: Secondary | ICD-10-CM | POA: Diagnosis not present

## 2017-06-04 DIAGNOSIS — M5440 Lumbago with sciatica, unspecified side: Secondary | ICD-10-CM | POA: Insufficient documentation

## 2017-06-04 NOTE — H&P (Signed)
Patient ID: Christopher Woods. MRN: [id number] DOB/AGE: Jun 05, 1955 62 y.o.  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  Patient has surgical intervention scheduled for 17 January he is having an L4-5 discectomy left far lateral.  Overall the patient reports a history of good health  Past Medical History: Past Medical History:  Diagnosis Date  . Dupuytren's disease   . Lumbago   . MVP (mitral valve prolapse)   . Trigeminal neuralgia    resolved  . Type 2 diabetes mellitus without complication ([place]) 2/95/2841    Surgical History: Past Surgical History:  Procedure Laterality Date  . HAND SURGERY Right    thumb reconstruction  . HIP SURGERY Left   . HIP SURGERY Right   . KNEE SURGERY Left   . KNEE SURGERY Right    meniscal tear  . TRIGGER FINGER RELEASE Left     Family History: Family History  Problem Relation Age of Onset  . Hypertension Mother   . Hypertension Father   . Cancer Father        prostate  . Hypertension Sister   . Cancer Sister        melanoma  . Kidney disease Sister     Social History: Social History   Socioeconomic History  . Marital status: Married    Spouse name: Not on file  . Number of children: Not on file  . Years of education: Not on file  . Highest education level: Not on file  Social Needs  . Financial resource strain: Not on file  . Food insecurity - worry: Not on file  . Food insecurity - inability: Not on file  . Transportation needs - medical: Not on file  . Transportation needs - non-medical: Not on file  Occupational History  . Not on file  Tobacco Use  . Smoking status: Never Smoker  . Smokeless tobacco: Never Used  Substance and Sexual Activity  . Alcohol use: No  . Drug use: No  . Sexual activity: Not on file  Other Topics Concern  . Not on file  Social History Narrative  . Not on  file    Allergies: Indomethacin  Medications: I have reviewed the patient's current medications.  Vital Signs: No data found.  Radiology: Ct Cardiac Scoring  Addendum Date: 05/21/2017   ADDENDUM REPORT: 05/21/2017 10:14 CLINICAL DATA:  Risk stratification EXAM: Coronary Calcium Score TECHNIQUE: The patient was scanned on a Siemens Somatom 64 slice scanner. Axial non-contrast 3 mm slices were carried out through the heart. The data set was analyzed on a dedicated work station and scored using the [place]. FINDINGS: Non-cardiac: See separate report from [facility]. Ascending Aorta:  Normal 3.2 cm Pericardium: Normal Coronary arteries: Calcium noted in ostial LAD and circumflex as well as distal LAD IMPRESSION: Coronary calcium score of 100. This was 47 th percentile for age and sex matched control. Jenkins Rouge Electronically Signed   By: Jenkins Rouge M.D.   On: 05/21/2017 10:14   Result Date: 05/21/2017 EXAM: OVER-READ INTERPRETATION  CT CHEST The following report is an over-read performed by radiologist Dr. Collene Leyden [facility], [place] on 05/17/2017. This over-read does not include interpretation of cardiac or coronary anatomy or pathology. The coronary calcium score interpretation by the cardiologist is attached. COMPARISON:  None. FINDINGS: Vascular: Heart is normal size.  Aorta is normal caliber.  Mediastinum/Nodes: No adenopathy in the lower mediastinum or hila. Lungs/Pleura: Visualized lungs are clear.  No effusions. Upper Abdomen: Imaging into the upper abdomen shows no acute findings. Musculoskeletal: Chest wall soft tissues are unremarkable. No acute bony abnormality. IMPRESSION: No acute or significant extracardiac abnormality. Electronically Signed: By: Rolm Baptise M.D. On: 05/17/2017 09:45    Labs: No results for input(s): WBC, RBC, HCT, PLT in the last 72 hours. No results for input(s): NA, K, CL, CO2, BUN, CREATININE, GLUCOSE, CALCIUM in the last 72  hours. No results for input(s): LABPT, INR in the last 72 hours.  Review of Systems: ROS  Physical Exam: There is no height or weight on file to calculate BMI. Patient is alert and oriented 3 no shortness of breath no chest pain lungs are clear to auscultation.  Heart is also in normal rate and rhythm on auscultation.  Abdomen is soft and nontender no incontinence of bowel and bladder.  Patient does have a foot drop noted with ambulation.  He has been compliant with his ASO brace positive femoral stretch positive straight leg raise on the left to out of 5 EHL/tibialis anterior strength 4 out of 5 gastrocnemius strength on the left 5 out of 5 strength noted on the right departments are soft and nontender intact pulses no obvious skin lesions or abrasions of the lumbar spine no previous surgeries.  MRI from November 2018 is significant for a far lateral L4-5 disc herniation with cranial migration there is also dorsal displacement of the exiting L4 nerve root on the left side    Assessment and Plan: Risks and benefits of surgery were discussed with the patient. These include: Infection, bleeding, death, stroke, paralysis, ongoing or worse pain, need for additional surgery, leak of spinal fluid, adjacent segment degeneration requiring additional surgery, post-operative hematoma formation that can result in neurological compromise and the need for urgent/emergent re-operation. Loss in bowel and bladder control. Injury to major vessels that could result in the need for urgent abdominal surgery to stop bleeding. Risk of deep venous thrombosis (DVT) and the need for additional treatment. Recurrent disc herniation resulting in the need for revision surgery, which could include fusion surgery (utilizing instrumentation such as pedicle screws and intervertebral cages).   Additional risk: If instrumentation is used to address spinal stenosis there is a risk of migration, or breakage of that hardware that could  require additional surgery.    Goal of surgery: Reduce (not eliminate) pain, and improve quality of life.   Pt receive LSO in clinic and understands to bring to  Hospital  She will make 2 week H&P office visit today  Ronette Deter, [facility] for Dr. Rolena Infante, [place] [phone]

## 2017-06-07 NOTE — Pre-Procedure Instructions (Signed)
Christopher Woods.  06/07/2017      [id number] [address], [place] [address] Phone: [phone] Fax: [phone]    Your procedure is scheduled on Thursday, June 10, 2017  Report to [facility] Admitting Entrance "A" at 9:00AM   Call this number if you have problems the morning of surgery:  [phone]   Remember:  Do not eat food or drink liquids after midnight.  Take these medicines the morning of surgery with A SIP OF WATER: Gabapentin (NEURONTIN). If needed HYDROcodone-acetaminophen (NORCO/VICODIN) for pain.  Follow your doctor's instruction regarding Aspirin.  As of today, stop taking all Aspirins, Vitamins, Fish oils, and Herbal medications. Also stop all NSAIDS i.e. Advil, Ibuprofen, Motrin, Aleve, Anaprox, Naproxen, BC and Goody Powders.  How to Manage Your Diabetes Before and After Surgery  Why is it important to control my blood sugar before and after surgery? . Improving blood sugar levels before and after surgery helps healing and can limit problems. . A way of improving blood sugar control is eating a healthy diet by: o  Eating less sugar and carbohydrates o  Increasing activity/exercise o  Talking with your doctor about reaching your blood sugar goals . High blood sugars (greater than 180 mg/dL) can raise your risk of infections and slow your recovery, so you will need to focus on controlling your diabetes during the weeks before surgery. . Make sure that the doctor who takes care of your diabetes knows about your planned surgery including the date and location.  How do I manage my blood sugar before surgery? . Check your blood sugar at least 4 times a day, starting 2 days before surgery, to make sure that the level is not too high or low. o Check your blood sugar the morning of your surgery when you wake up and every 2 hours until you get to the Short Stay unit. . If your blood sugar  is less than 70 mg/dL, you will need to treat for low blood sugar: o Do not take insulin. o Treat a low blood sugar (less than 70 mg/dL) with  cup of clear juice (cranberry or apple), 4 glucose tablets, OR glucose gel. Recheck blood sugar in 15 minutes after treatment (to make sure it is greater than 70 mg/dL). If your blood sugar is not greater than 70 mg/dL on recheck, call [phone] o  for further instructions. . Report your blood sugar to the short stay nurse when you get to Short Stay.  . If you are admitted to the hospital after surgery: o Your blood sugar will be checked by the staff and you will probably be given insulin after surgery (instead of oral diabetes medicines) to make sure you have good blood sugar levels. o The goal for blood sugar control after surgery is 80-180 mg/dL.  WHAT DO I DO ABOUT MY DIABETES MEDICATION?  Marland Kitchen Do not take MetFORMIN (GLUCOPHAGE)  the morning of surgery.  . If your CBG is greater than 220 mg/dL, call us at [phone]   Do not wear jewelry.  Do not wear lotions, powders,colognes, or deodorant.  Do not shave 48 hours prior to surgery.  Men may shave face and neck.  Do not bring valuables to the hospital.  [facility] is not responsible for any belongings or valuables.  Contacts, dentures or bridgework may not be worn into surgery.  Leave your suitcase in the car.  After surgery it  may be brought to your room.  For patients admitted to the hospital, discharge time will be determined by your treatment team.  Patients discharged the day of surgery will not be allowed to drive home.   Special instructions:   [place]- Preparing For Surgery  Before surgery, you can play an important role. Because skin is not sterile, your skin needs to be as free of germs as possible. You can reduce the number of germs on your skin by washing with CHG (chlorahexidine gluconate) Soap before surgery.  CHG is an antiseptic cleaner which kills germs and bonds  with the skin to continue killing germs even after washing.  Please do not use if you have an allergy to CHG or antibacterial soaps. If your skin becomes reddened/irritated stop using the CHG.  Do not shave (including legs and underarms) for at least 48 hours prior to first CHG shower. It is OK to shave your face.  Please follow these instructions carefully.   1. Shower the NIGHT BEFORE SURGERY and the MORNING OF SURGERY with CHG.   2. If you chose to wash your hair, wash your hair first as usual with your normal shampoo.  3. After you shampoo, rinse your hair and body thoroughly to remove the shampoo.  4. Use CHG as you would any other liquid soap. You can apply CHG directly to the skin and wash gently with a scrungie or a clean washcloth.   5. Apply the CHG Soap to your body ONLY FROM THE NECK DOWN.  Do not use on open wounds or open sores. Avoid contact with your eyes, ears, mouth and genitals (private parts). Wash Face and genitals (private parts)  with your normal soap.  6. Wash thoroughly, paying special attention to the area where your surgery will be performed.  7. Thoroughly rinse your body with warm water from the neck down.  8. DO NOT shower/wash with your normal soap after using and rinsing off the CHG Soap.  9. Pat yourself dry with a CLEAN TOWEL.  10. Wear CLEAN PAJAMAS to bed the night before surgery, wear comfortable clothes the morning of surgery  11. Place CLEAN SHEETS on your bed the night of your first shower and DO NOT SLEEP WITH PETS.  Day of Surgery: Do not apply any deodorants/lotions. Please wear clean clothes to the hospital/surgery center.    Please read over the following fact sheets that you were given. Pain Booklet, Coughing and Deep Breathing, MRSA Information and Surgical Site Infection Prevention

## 2017-06-08 ENCOUNTER — Encounter (HOSPITAL_COMMUNITY)
Admission: RE | Admit: 2017-06-08 | Discharge: 2017-06-08 | Disposition: A | Discharge status: Home / Self Care | Payer: BLUE CROSS/BLUE SHIELD | Source: Ambulatory Visit | Attending: Orthopedic Surgery | Admitting: Orthopedic Surgery

## 2017-06-08 ENCOUNTER — Encounter (HOSPITAL_COMMUNITY): Payer: Self-pay

## 2017-06-08 ENCOUNTER — Other Ambulatory Visit: Payer: Self-pay

## 2017-06-08 DIAGNOSIS — Z7982 Long term (current) use of aspirin: Secondary | ICD-10-CM | POA: Diagnosis not present

## 2017-06-08 DIAGNOSIS — Z8249 Family history of ischemic heart disease and other diseases of the circulatory system: Secondary | ICD-10-CM | POA: Diagnosis not present

## 2017-06-08 DIAGNOSIS — Z9889 Other specified postprocedural states: Secondary | ICD-10-CM | POA: Diagnosis not present

## 2017-06-08 DIAGNOSIS — Z8042 Family history of malignant neoplasm of prostate: Secondary | ICD-10-CM | POA: Diagnosis not present

## 2017-06-08 DIAGNOSIS — M21372 Foot drop, left foot: Secondary | ICD-10-CM | POA: Diagnosis not present

## 2017-06-08 DIAGNOSIS — Z87442 Personal history of urinary calculi: Secondary | ICD-10-CM | POA: Diagnosis not present

## 2017-06-08 DIAGNOSIS — M720 Palmar fascial fibromatosis [Dupuytren]: Secondary | ICD-10-CM | POA: Diagnosis not present

## 2017-06-08 DIAGNOSIS — Z888 Allergy status to other drugs, medicaments and biological substances status: Secondary | ICD-10-CM | POA: Diagnosis not present

## 2017-06-08 DIAGNOSIS — E119 Type 2 diabetes mellitus without complications: Secondary | ICD-10-CM | POA: Diagnosis not present

## 2017-06-08 DIAGNOSIS — Z808 Family history of malignant neoplasm of other organs or systems: Secondary | ICD-10-CM | POA: Diagnosis not present

## 2017-06-08 DIAGNOSIS — Z7984 Long term (current) use of oral hypoglycemic drugs: Secondary | ICD-10-CM | POA: Diagnosis not present

## 2017-06-08 DIAGNOSIS — Z79899 Other long term (current) drug therapy: Secondary | ICD-10-CM | POA: Diagnosis not present

## 2017-06-08 DIAGNOSIS — I341 Nonrheumatic mitral (valve) prolapse: Secondary | ICD-10-CM | POA: Diagnosis not present

## 2017-06-08 DIAGNOSIS — M5116 Intervertebral disc disorders with radiculopathy, lumbar region: Secondary | ICD-10-CM | POA: Diagnosis not present

## 2017-06-08 HISTORY — DX: Personal history of urinary calculi: Z87.442

## 2017-06-08 HISTORY — DX: Unspecified osteoarthritis, unspecified site: M19.90

## 2017-06-08 LAB — CBC
HCT: 44.6 % (ref 39.0–52.0)
Hemoglobin: 15 g/dL (ref 13.0–17.0)
MCH: 31.4 pg (ref 26.0–34.0)
MCHC: 33.6 g/dL (ref 30.0–36.0)
MCV: 93.5 fL (ref 78.0–100.0)
Platelets: 248 10*3/uL (ref 150–400)
RBC: 4.77 MIL/uL (ref 4.22–5.81)
RDW: 12.8 % (ref 11.5–15.5)
WBC: 5.1 10*3/uL (ref 4.0–10.5)

## 2017-06-08 LAB — BASIC METABOLIC PANEL
Anion gap: 8 (ref 5–15)
BUN: 16 mg/dL (ref 6–20)
CO2: 27 mmol/L (ref 22–32)
Calcium: 9.5 mg/dL (ref 8.9–10.3)
Chloride: 103 mmol/L (ref 101–111)
Creatinine, Ser: 1.05 mg/dL (ref 0.61–1.24)
GFR calc Af Amer: >60 mL/min (ref >60)
GFR calc non Af Amer: >60 mL/min (ref >60)
Glucose, Bld: 132 mg/dL — ABNORMAL HIGH (ref 65–99)
Potassium: 4.3 mmol/L (ref 3.5–5.1)
Sodium: 138 mmol/L (ref 135–145)

## 2017-06-08 LAB — GLUCOSE, CAPILLARY: Glucose-Capillary: 115 mg/dL — ABNORMAL HIGH (ref 65–99)

## 2017-06-08 LAB — SURGICAL PCR SCREEN
MRSA, PCR: NEGATIVE
Staphylococcus aureus: NEGATIVE

## 2017-06-08 NOTE — Progress Notes (Signed)
PCP: Lenis Dickinson MD  Cardiologist: Nicanor Bake, MD -for elevated cholesterol  EKG: 04/12/17  Stress test: 5+ years ago  ECHO: pt denies  Cardiac Cath: pt denies  Chest x-ray:  Pt denies, no recent respiratory complications/infections

## 2017-06-09 NOTE — Progress Notes (Signed)
Anesthesia Chart Review:  Pt is a 62 year old male scheduled for L4-5 discectomy on 06/10/2017 with Melina Schools, MD  - PCP is [name], MD who cleared pt for surgery at last office visit 05/13/17.  - Saw cardiologist Ida Rogue, MD for hyperlipidemia 04/12/17. PRN f/u recommended  PMH includes:  MVP, DM.  Never smoker.  BMI 26.  Medications include: ASA 81 mg, Zetia, metformin, rosuvastatin  BP 122/79   Pulse 71   Temp 36.8 C (Oral)   Resp 16   Ht 6\' 1"  (1.854 m)   Wt 200 lb (90.7 kg)   SpO2 100%   BMI 26.39 kg/m   Preoperative labs reviewed.    EKG 04/12/17: NSR. Possible LA enlargement  CT cardiac scoring 05/21/17:  - Coronary calcium score of 100. This was 26 th percentile for age and sex matched control.  If no changes, I anticipate pt can proceed with surgery as scheduled.   Willeen Cass, FNP-BC [facility]/Anesthesiology Phone: [phone] 06/09/2017 10:30 AM

## 2017-06-10 ENCOUNTER — Encounter (HOSPITAL_COMMUNITY): Payer: Self-pay | Admitting: Anesthesiology

## 2017-06-10 ENCOUNTER — Encounter (HOSPITAL_COMMUNITY)
Admission: RE | Disposition: A | Discharge status: Home / Self Care | Payer: Self-pay | Source: Ambulatory Visit | Attending: Orthopedic Surgery

## 2017-06-10 ENCOUNTER — Ambulatory Visit (HOSPITAL_COMMUNITY): Payer: BLUE CROSS/BLUE SHIELD

## 2017-06-10 ENCOUNTER — Ambulatory Visit (HOSPITAL_COMMUNITY): Payer: BLUE CROSS/BLUE SHIELD | Admitting: Emergency Medicine

## 2017-06-10 ENCOUNTER — Ambulatory Visit (HOSPITAL_COMMUNITY): Payer: BLUE CROSS/BLUE SHIELD | Admitting: Anesthesiology

## 2017-06-10 ENCOUNTER — Observation Stay (HOSPITAL_COMMUNITY)
Admission: RE | Admit: 2017-06-10 | Discharge: 2017-06-11 | Disposition: A | Discharge status: Home / Self Care | Payer: BLUE CROSS/BLUE SHIELD | Source: Ambulatory Visit | Attending: Orthopedic Surgery | Admitting: Orthopedic Surgery

## 2017-06-10 DIAGNOSIS — Z7982 Long term (current) use of aspirin: Secondary | ICD-10-CM | POA: Diagnosis not present

## 2017-06-10 DIAGNOSIS — E1151 Type 2 diabetes mellitus with diabetic peripheral angiopathy without gangrene: Secondary | ICD-10-CM | POA: Diagnosis not present

## 2017-06-10 DIAGNOSIS — M720 Palmar fascial fibromatosis [Dupuytren]: Secondary | ICD-10-CM | POA: Insufficient documentation

## 2017-06-10 DIAGNOSIS — I341 Nonrheumatic mitral (valve) prolapse: Secondary | ICD-10-CM | POA: Diagnosis not present

## 2017-06-10 DIAGNOSIS — Z8249 Family history of ischemic heart disease and other diseases of the circulatory system: Secondary | ICD-10-CM | POA: Insufficient documentation

## 2017-06-10 DIAGNOSIS — Z87442 Personal history of urinary calculi: Secondary | ICD-10-CM | POA: Insufficient documentation

## 2017-06-10 DIAGNOSIS — Z8042 Family history of malignant neoplasm of prostate: Secondary | ICD-10-CM | POA: Diagnosis not present

## 2017-06-10 DIAGNOSIS — Z808 Family history of malignant neoplasm of other organs or systems: Secondary | ICD-10-CM | POA: Diagnosis not present

## 2017-06-10 DIAGNOSIS — Z419 Encounter for procedure for purposes other than remedying health state, unspecified: Secondary | ICD-10-CM

## 2017-06-10 DIAGNOSIS — M5126 Other intervertebral disc displacement, lumbar region: Secondary | ICD-10-CM | POA: Diagnosis not present

## 2017-06-10 DIAGNOSIS — M5116 Intervertebral disc disorders with radiculopathy, lumbar region: Principal | ICD-10-CM | POA: Insufficient documentation

## 2017-06-10 DIAGNOSIS — Z7984 Long term (current) use of oral hypoglycemic drugs: Secondary | ICD-10-CM | POA: Insufficient documentation

## 2017-06-10 DIAGNOSIS — Z79899 Other long term (current) drug therapy: Secondary | ICD-10-CM | POA: Diagnosis not present

## 2017-06-10 DIAGNOSIS — E7800 Pure hypercholesterolemia, unspecified: Secondary | ICD-10-CM | POA: Diagnosis not present

## 2017-06-10 DIAGNOSIS — E119 Type 2 diabetes mellitus without complications: Secondary | ICD-10-CM | POA: Insufficient documentation

## 2017-06-10 DIAGNOSIS — Z888 Allergy status to other drugs, medicaments and biological substances status: Secondary | ICD-10-CM | POA: Insufficient documentation

## 2017-06-10 DIAGNOSIS — Z981 Arthrodesis status: Secondary | ICD-10-CM | POA: Diagnosis not present

## 2017-06-10 DIAGNOSIS — Z9889 Other specified postprocedural states: Secondary | ICD-10-CM

## 2017-06-10 DIAGNOSIS — M21372 Foot drop, left foot: Secondary | ICD-10-CM | POA: Insufficient documentation

## 2017-06-10 DIAGNOSIS — I10 Essential (primary) hypertension: Secondary | ICD-10-CM | POA: Diagnosis not present

## 2017-06-10 HISTORY — PX: LUMBAR LAMINECTOMY/DECOMPRESSION MICRODISCECTOMY: SHX5026

## 2017-06-10 HISTORY — DX: Other specified postprocedural states: Z98.890

## 2017-06-10 LAB — GLUCOSE, CAPILLARY
Glucose-Capillary: 121 mg/dL — ABNORMAL HIGH (ref 65–99)
Glucose-Capillary: 114 mg/dL — ABNORMAL HIGH (ref 65–99)
Glucose-Capillary: 189 mg/dL — ABNORMAL HIGH (ref 65–99)
Glucose-Capillary: 225 mg/dL — ABNORMAL HIGH (ref 65–99)

## 2017-06-10 SURGERY — LUMBAR LAMINECTOMY/DECOMPRESSION MICRODISCECTOMY 1 LEVEL
Anesthesia: General | Laterality: Left

## 2017-06-10 MED ORDER — CEFAZOLIN SODIUM-DEXTROSE 2-4 GM/100ML-% IV SOLN
2.0000 g | Freq: Three times a day (TID) | INTRAVENOUS | Status: AC
  Administered 2017-06-10 – 2017-06-11 (×2): 2 g via INTRAVENOUS
  Filled 2017-06-10 (×2): qty 100

## 2017-06-10 MED ORDER — EPINEPHRINE PF 1 MG/ML IJ SOLN
INTRAMUSCULAR | Status: DC | PRN
  Administered 2017-06-10: .15 mL

## 2017-06-10 MED ORDER — ONDANSETRON HCL 4 MG/2ML IJ SOLN: 4.0000 mg | Freq: Four times a day (QID) | INTRAMUSCULAR | Status: DC | PRN

## 2017-06-10 MED ORDER — DOCUSATE SODIUM 100 MG PO CAPS
100.0000 mg | ORAL_CAPSULE | Freq: Two times a day (BID) | ORAL | Status: DC
  Administered 2017-06-10: 100 mg via ORAL
  Filled 2017-06-10: qty 1

## 2017-06-10 MED ORDER — MENTHOL 3 MG MT LOZG: 1.0000 | LOZENGE | OROMUCOSAL | Status: DC | PRN

## 2017-06-10 MED ORDER — METFORMIN HCL 500 MG PO TABS
500.0000 mg | ORAL_TABLET | Freq: Every day | ORAL | Status: DC
  Administered 2017-06-10: 500 mg via ORAL
  Filled 2017-06-10: qty 1

## 2017-06-10 MED ORDER — THROMBIN (RECOMBINANT) 20000 UNITS EX SOLR
CUTANEOUS | Status: AC
  Filled 2017-06-10: qty 20000

## 2017-06-10 MED ORDER — BUPIVACAINE HCL (PF) 0.25 % IJ SOLN
INTRAMUSCULAR | Status: AC
  Filled 2017-06-10: qty 30

## 2017-06-10 MED ORDER — SODIUM CHLORIDE 0.9% FLUSH
3.0000 mL | Freq: Two times a day (BID) | INTRAVENOUS | Status: DC
  Administered 2017-06-10: 3 mL via INTRAVENOUS

## 2017-06-10 MED ORDER — OXYCODONE-ACETAMINOPHEN 10-325 MG PO TABS: 1.0000 | ORAL_TABLET | ORAL | 0 refills | Status: DC | PRN

## 2017-06-10 MED ORDER — GABAPENTIN 300 MG PO CAPS
300.0000 mg | ORAL_CAPSULE | Freq: Two times a day (BID) | ORAL | Status: DC
  Administered 2017-06-10: 300 mg via ORAL
  Filled 2017-06-10: qty 1

## 2017-06-10 MED ORDER — METHOCARBAMOL 1000 MG/10ML IJ SOLN: 500.0000 mg | Freq: Four times a day (QID) | INTRAVENOUS | Status: DC | PRN

## 2017-06-10 MED ORDER — CEFAZOLIN SODIUM-DEXTROSE 2-4 GM/100ML-% IV SOLN
INTRAVENOUS | Status: AC
  Filled 2017-06-10: qty 100

## 2017-06-10 MED ORDER — ACETAMINOPHEN 10 MG/ML IV SOLN
INTRAVENOUS | Status: AC
  Filled 2017-06-10: qty 100

## 2017-06-10 MED ORDER — METHYLPREDNISOLONE ACETATE 40 MG/ML IJ SUSP
INTRAMUSCULAR | Status: DC | PRN
  Administered 2017-06-10: 40 mg

## 2017-06-10 MED ORDER — 0.9 % SODIUM CHLORIDE (POUR BTL) OPTIME
TOPICAL | Status: DC | PRN
  Administered 2017-06-10: 1000 mL

## 2017-06-10 MED ORDER — FENTANYL CITRATE (PF) 100 MCG/2ML IJ SOLN: 25.0000 ug | INTRAMUSCULAR | Status: DC | PRN

## 2017-06-10 MED ORDER — FENTANYL CITRATE (PF) 250 MCG/5ML IJ SOLN
INTRAMUSCULAR | Status: AC
  Filled 2017-06-10: qty 5

## 2017-06-10 MED ORDER — POLYETHYLENE GLYCOL 3350 17 G PO PACK
17.0000 g | PACK | Freq: Every day | ORAL | Status: DC | PRN
  Administered 2017-06-10: 17 g via ORAL
  Filled 2017-06-10: qty 1

## 2017-06-10 MED ORDER — MAGNESIUM CITRATE PO SOLN: 1.0000 | Freq: Once | ORAL | Status: DC | PRN

## 2017-06-10 MED ORDER — METHYLPREDNISOLONE ACETATE 40 MG/ML IJ SUSP
INTRAMUSCULAR | Status: AC
  Filled 2017-06-10: qty 1

## 2017-06-10 MED ORDER — CEFAZOLIN SODIUM-DEXTROSE 2-4 GM/100ML-% IV SOLN
2.0000 g | INTRAVENOUS | Status: AC
  Administered 2017-06-10: 2 g via INTRAVENOUS

## 2017-06-10 MED ORDER — SCOPOLAMINE 1 MG/3DAYS TD PT72
MEDICATED_PATCH | TRANSDERMAL | Status: AC
  Filled 2017-06-10: qty 1

## 2017-06-10 MED ORDER — ACETAMINOPHEN 650 MG RE SUPP: 650.0000 mg | RECTAL | Status: DC | PRN

## 2017-06-10 MED ORDER — OXYCODONE HCL 5 MG PO TABS: 10.0000 mg | ORAL_TABLET | ORAL | Status: DC | PRN

## 2017-06-10 MED ORDER — DEXAMETHASONE SODIUM PHOSPHATE 4 MG/ML IJ SOLN
INTRAMUSCULAR | Status: DC | PRN
  Administered 2017-06-10: 10 mg via INTRAVENOUS

## 2017-06-10 MED ORDER — THROMBIN (RECOMBINANT) 20000 UNITS EX SOLR
CUTANEOUS | Status: DC | PRN
  Administered 2017-06-10: 20000 [IU] via TOPICAL

## 2017-06-10 MED ORDER — SODIUM CHLORIDE 0.9% FLUSH: 3.0000 mL | INTRAVENOUS | Status: DC | PRN

## 2017-06-10 MED ORDER — METHOCARBAMOL 500 MG PO TABS
500.0000 mg | ORAL_TABLET | Freq: Four times a day (QID) | ORAL | Status: DC | PRN
  Administered 2017-06-10: 500 mg via ORAL
  Filled 2017-06-10: qty 1

## 2017-06-10 MED ORDER — MORPHINE SULFATE (PF) 4 MG/ML IV SOLN: 1.0000 mg | INTRAVENOUS | Status: DC | PRN

## 2017-06-10 MED ORDER — LIDOCAINE 2% (20 MG/ML) 5 ML SYRINGE
INTRAMUSCULAR | Status: AC
  Filled 2017-06-10: qty 5

## 2017-06-10 MED ORDER — ROSUVASTATIN CALCIUM 5 MG PO TABS
10.0000 mg | ORAL_TABLET | Freq: Every evening | ORAL | Status: DC
  Administered 2017-06-10: 10 mg via ORAL
  Filled 2017-06-10: qty 2

## 2017-06-10 MED ORDER — ACETAMINOPHEN 10 MG/ML IV SOLN
INTRAVENOUS | Status: DC | PRN
  Administered 2017-06-10: 1000 mg via INTRAVENOUS

## 2017-06-10 MED ORDER — ROCURONIUM BROMIDE 10 MG/ML (PF) SYRINGE
PREFILLED_SYRINGE | INTRAVENOUS | Status: DC | PRN
  Administered 2017-06-10: 50 mg via INTRAVENOUS

## 2017-06-10 MED ORDER — ONDANSETRON HCL 4 MG PO TABS: 4.0000 mg | ORAL_TABLET | Freq: Four times a day (QID) | ORAL | Status: DC | PRN

## 2017-06-10 MED ORDER — ONDANSETRON 4 MG PO TBDP: 4.0000 mg | ORAL_TABLET | Freq: Three times a day (TID) | ORAL | 0 refills | Status: DC | PRN

## 2017-06-10 MED ORDER — BUPIVACAINE HCL (PF) 0.25 % IJ SOLN
INTRAMUSCULAR | Status: DC | PRN
  Administered 2017-06-10: 10 mL

## 2017-06-10 MED ORDER — SCOPOLAMINE 1 MG/3DAYS TD PT72
1.0000 | MEDICATED_PATCH | TRANSDERMAL | Status: DC
  Administered 2017-06-10: 1.5 mg via TRANSDERMAL

## 2017-06-10 MED ORDER — HEMOSTATIC AGENTS (NO CHARGE) OPTIME
TOPICAL | Status: DC | PRN
  Administered 2017-06-10: 1 via TOPICAL

## 2017-06-10 MED ORDER — PROPOFOL 10 MG/ML IV BOLUS
INTRAVENOUS | Status: DC | PRN
  Administered 2017-06-10: 150 mg via INTRAVENOUS

## 2017-06-10 MED ORDER — MIDAZOLAM HCL 2 MG/2ML IJ SOLN
INTRAMUSCULAR | Status: AC
  Filled 2017-06-10: qty 2

## 2017-06-10 MED ORDER — SUGAMMADEX SODIUM 200 MG/2ML IV SOLN
INTRAVENOUS | Status: DC | PRN
  Administered 2017-06-10: 200 mg via INTRAVENOUS

## 2017-06-10 MED ORDER — FENTANYL CITRATE (PF) 100 MCG/2ML IJ SOLN
INTRAMUSCULAR | Status: DC | PRN
  Administered 2017-06-10 (×2): 100 ug via INTRAVENOUS

## 2017-06-10 MED ORDER — SUCCINYLCHOLINE CHLORIDE 200 MG/10ML IV SOSY
PREFILLED_SYRINGE | INTRAVENOUS | Status: AC
  Filled 2017-06-10: qty 10

## 2017-06-10 MED ORDER — MIDAZOLAM HCL 5 MG/5ML IJ SOLN
INTRAMUSCULAR | Status: DC | PRN
  Administered 2017-06-10: 2 mg via INTRAVENOUS

## 2017-06-10 MED ORDER — LACTATED RINGERS IV SOLN: INTRAVENOUS | Status: DC

## 2017-06-10 MED ORDER — LIDOCAINE 2% (20 MG/ML) 5 ML SYRINGE
INTRAMUSCULAR | Status: DC | PRN
  Administered 2017-06-10: 60 mg via INTRAVENOUS

## 2017-06-10 MED ORDER — EZETIMIBE 10 MG PO TABS
10.0000 mg | ORAL_TABLET | Freq: Every day | ORAL | Status: DC
  Filled 2017-06-10: qty 1

## 2017-06-10 MED ORDER — PHENOL 1.4 % MT LIQD: 1.0000 | OROMUCOSAL | Status: DC | PRN

## 2017-06-10 MED ORDER — LACTATED RINGERS IV SOLN
INTRAVENOUS | Status: DC | PRN
  Administered 2017-06-10: 09:00:00 via INTRAVENOUS

## 2017-06-10 MED ORDER — PROPOFOL 10 MG/ML IV BOLUS
INTRAVENOUS | Status: AC
  Filled 2017-06-10: qty 20

## 2017-06-10 MED ORDER — OXYCODONE HCL 5 MG PO TABS: 5.0000 mg | ORAL_TABLET | ORAL | Status: DC | PRN

## 2017-06-10 MED ORDER — EPINEPHRINE PF 1 MG/ML IJ SOLN
INTRAMUSCULAR | Status: AC
  Filled 2017-06-10: qty 1

## 2017-06-10 MED ORDER — METHOCARBAMOL 500 MG PO TABS: 500.0000 mg | ORAL_TABLET | Freq: Three times a day (TID) | ORAL | 0 refills | Status: DC

## 2017-06-10 MED ORDER — ACETAMINOPHEN 325 MG PO TABS: 650.0000 mg | ORAL_TABLET | ORAL | Status: DC | PRN

## 2017-06-10 MED ORDER — INSULIN ASPART 100 UNIT/ML ~~LOC~~ SOLN: 0.0000 [IU] | Freq: Three times a day (TID) | SUBCUTANEOUS | Status: DC

## 2017-06-10 MED ORDER — INSULIN ASPART 100 UNIT/ML ~~LOC~~ SOLN
0.0000 [IU] | Freq: Every day | SUBCUTANEOUS | Status: DC
  Administered 2017-06-10: 2 [IU] via SUBCUTANEOUS

## 2017-06-10 MED ORDER — SODIUM CHLORIDE 0.9 % IV SOLN: 250.0000 mL | INTRAVENOUS | Status: DC

## 2017-06-10 MED ORDER — ONDANSETRON HCL 4 MG/2ML IJ SOLN
INTRAMUSCULAR | Status: DC | PRN
  Administered 2017-06-10: 4 mg via INTRAVENOUS

## 2017-06-10 SURGICAL SUPPLY — 62 items
BNDG COHESIVE 6X5 TAN NS LF (GAUZE/BANDAGES/DRESSINGS) ×2 IMPLANT
BUR EGG ELITE 4.0 (BURR) IMPLANT
BUR MATCHSTICK NEURO 3.0 LAGG (BURR) IMPLANT
CANISTER SUCT 3000ML PPV (MISCELLANEOUS) ×2 IMPLANT
CLSR STERI-STRIP ANTIMIC 1/2X4 (GAUZE/BANDAGES/DRESSINGS) ×2 IMPLANT
CORD BI POLAR (MISCELLANEOUS) ×2 IMPLANT
COVER SURGICAL LIGHT HANDLE (MISCELLANEOUS) ×2 IMPLANT
DRAIN CHANNEL 15F RND FF W/TCR (WOUND CARE) IMPLANT
DRAPE POUCH INSTRU U-SHP 10X18 (DRAPES) ×2 IMPLANT
DRAPE SURG 17X23 STRL (DRAPES) ×2 IMPLANT
DRAPE U-SHAPE 47X51 STRL (DRAPES) ×2 IMPLANT
DRSG AQUACEL AG ADV 3.5X 4 (GAUZE/BANDAGES/DRESSINGS) IMPLANT
DRSG AQUACEL AG ADV 3.5X 6 (GAUZE/BANDAGES/DRESSINGS) IMPLANT
DURAPREP 26ML APPLICATOR (WOUND CARE) ×2 IMPLANT
ELECT BLADE 4.0 EZ CLEAN MEGAD (MISCELLANEOUS)
ELECT CAUTERY BLADE 6.4 (BLADE) ×2 IMPLANT
ELECT PENCIL ROCKER SW 15FT (MISCELLANEOUS) ×2 IMPLANT
ELECT REM PT RETURN 9FT ADLT (ELECTROSURGICAL) ×2
ELECTRODE BLDE 4.0 EZ CLN MEGD (MISCELLANEOUS) IMPLANT
ELECTRODE REM PT RTRN 9FT ADLT (ELECTROSURGICAL) ×1 IMPLANT
EVACUATOR SILICONE 100CC (DRAIN) IMPLANT
GLOVE BIO SURGEON STRL SZ 6.5 (GLOVE) ×2 IMPLANT
GLOVE BIOGEL PI IND STRL 6.5 (GLOVE) ×1 IMPLANT
GLOVE BIOGEL PI IND STRL 8.5 (GLOVE) ×1 IMPLANT
GLOVE BIOGEL PI INDICATOR 6.5 (GLOVE) ×1
GLOVE BIOGEL PI INDICATOR 8.5 (GLOVE) ×1
GLOVE SS BIOGEL STRL SZ 8.5 (GLOVE) ×1 IMPLANT
GLOVE SUPERSENSE BIOGEL SZ 8.5 (GLOVE) ×1
GOWN STRL REUS W/TWL 2XL LVL3 (GOWN DISPOSABLE) ×4 IMPLANT
KIT BASIN OR (CUSTOM PROCEDURE TRAY) ×2 IMPLANT
KIT ROOM TURNOVER OR (KITS) ×2 IMPLANT
NEEDLE 22X1 1/2 (OR ONLY) (NEEDLE) ×2 IMPLANT
NEEDLE SPNL 18GX3.5 QUINCKE PK (NEEDLE) ×4 IMPLANT
NS IRRIG 1000ML POUR BTL (IV SOLUTION) ×2 IMPLANT
PACK LAMINECTOMY ORTHO (CUSTOM PROCEDURE TRAY) ×2 IMPLANT
PACK UNIVERSAL I (CUSTOM PROCEDURE TRAY) ×2 IMPLANT
PAD ARMBOARD 7.5X6 YLW CONV (MISCELLANEOUS) ×4 IMPLANT
PATTIES SURGICAL .5 X.5 (GAUZE/BANDAGES/DRESSINGS) IMPLANT
PATTIES SURGICAL .5 X1 (DISPOSABLE) ×2 IMPLANT
SPONGE SURGIFOAM ABS GEL 100 (HEMOSTASIS) IMPLANT
SURGIFLO W/THROMBIN 8M KIT (HEMOSTASIS) ×2 IMPLANT
SUT BONE WAX W31G (SUTURE) ×2 IMPLANT
SUT MON AB 3-0 SH 27 (SUTURE)
SUT MON AB 3-0 SH27 (SUTURE) IMPLANT
SUT STRATAFIX 1PDS 45CM VIOLET (SUTURE) IMPLANT
SUT STRATAFIX MNCRL+ 3-0 PS-2 (SUTURE)
SUT STRATAFIX MONOCRYL 3-0 (SUTURE)
SUT STRATAFIX SPIRAL + 2-0 (SUTURE) IMPLANT
SUT VIC AB 0 CT1 27 (SUTURE)
SUT VIC AB 0 CT1 27XBRD ANBCTR (SUTURE) IMPLANT
SUT VIC AB 1 CT1 18XCR BRD 8 (SUTURE) ×1 IMPLANT
SUT VIC AB 1 CT1 8-18 (SUTURE) ×1
SUT VIC AB 1 CTX 36 (SUTURE)
SUT VIC AB 1 CTX36XBRD ANBCTR (SUTURE) IMPLANT
SUT VIC AB 2-0 CT1 18 (SUTURE) IMPLANT
SUTURE STRATFX MNCRL+ 3-0 PS-2 (SUTURE) IMPLANT
SYR BULB IRRIGATION 50ML (SYRINGE) ×2 IMPLANT
SYR CONTROL 10ML LL (SYRINGE) ×2 IMPLANT
TOWEL OR 17X24 6PK STRL BLUE (TOWEL DISPOSABLE) ×2 IMPLANT
TOWEL OR 17X26 10 PK STRL BLUE (TOWEL DISPOSABLE) ×2 IMPLANT
WATER STERILE IRR 1000ML POUR (IV SOLUTION) IMPLANT
YANKAUER SUCT BULB TIP NO VENT (SUCTIONS) ×2 IMPLANT

## 2017-06-10 NOTE — Anesthesia Procedure Notes (Signed)
Procedure Name: Intubation Date/Time: 06/10/2017 11:49 AM Performed by: Lieutenant Diego, CRNA Pre-anesthesia Checklist: Patient identified, Emergency Drugs available, Suction available and Patient being monitored Patient Re-evaluated:Patient Re-evaluated prior to induction Oxygen Delivery Method: Circle system utilized Preoxygenation: Pre-oxygenation with 100% oxygen Induction Type: IV induction Ventilation: Mask ventilation without difficulty Laryngoscope Size: Miller and 2 Grade View: Grade I Tube type: Oral Tube size: 7.5 mm Number of attempts: 1 Airway Equipment and Method: Stylet and Oral airway Placement Confirmation: ETT inserted through vocal cords under direct vision,  positive ETCO2 and breath sounds checked- equal and bilateral Secured at: 24 cm Tube secured with: Tape Dental Injury: Teeth and Oropharynx as per pre-operative assessment

## 2017-06-10 NOTE — Brief Op Note (Signed)
06/10/2017  1:07 PM  PATIENT:  Christopher Woods.  62 y.o. male  PRE-OPERATIVE DIAGNOSIS:  HNP L4-5 Left lateral  POST-OPERATIVE DIAGNOSIS:  HNP L4-5 Left lateral  PROCEDURE:  Procedure(s) with comments: L4-5 disectomy left far lateral (Left) - 120 mins  SURGEON:  Surgeon(s) and Role:    Melina Schools, MD - Primary  PHYSICIAN ASSISTANT:   ASSISTANTS: Carmen Mayo   ANESTHESIA:   general  EBL:  minimal   BLOOD ADMINISTERED:none  DRAINS: none   LOCAL MEDICATIONS USED:  MARCAINE    and  depomedrol  SPECIMEN:  No Specimen  DISPOSITION OF SPECIMEN:  N/A  COUNTS:  YES  TOURNIQUET:  * No tourniquets in log *  DICTATION: .Dragon Dictation  PLAN OF CARE: Admit for overnight observation  PATIENT DISPOSITION:  PACU - hemodynamically stable.

## 2017-06-10 NOTE — Anesthesia Postprocedure Evaluation (Signed)
Anesthesia Post Note  Patient: Christopher Woods.  Procedure(s) Performed: L4-5 disectomy left far lateral (Left )     Patient location during evaluation: PACU Anesthesia Type: General Level of consciousness: awake Pain management: pain level controlled Vital Signs Assessment: post-procedure vital signs reviewed and stable Respiratory status: spontaneous breathing Cardiovascular status: stable Anesthetic complications: no    Last Vitals:  Vitals:   06/10/17 1320 06/10/17 1403  BP: 126/68 128/74  Pulse: 64 70  Resp: 10 15  Temp: (!) 36.4 C   SpO2: 100% 100%    Last Pain:  Vitals:   06/10/17 1320  TempSrc:   PainSc: Asleep                 Annalycia Done

## 2017-06-10 NOTE — Op Note (Addendum)
Operative report.  Preoperative diagnosis: Far lateral left L4-5 disc herniation.  Postoperative diagnosis: Same.  Operative procedure: Far lateral lumbar discectomy L4.  Lateral foraminotomy for discectomy.  First Assistant: Ronette Deter, [place].  Complications: None.  Intraoperative findings: Large foraminal disc herniation impression of the exiting L4 nerve root.  Removed 2 large fragments of disc consistent with what was seen on the preoperative MRI.  Indications: This is a very pleasant 62 year old who presents with debilitating back but and radicular left leg pain.  Attempts at conservative management failed to alleviate his symptoms so we elected to proceed with surgery.  Appropriate risks benefits and alternatives were discussed with the patient and consent was obtained.  Operative report patient was brought to the operating room placed upon the operating room table.  After successful induction of general anesthesia and endotracheally patient teds SCDs were applied and he was turned prone onto the Wilson frame.  All bony prominences well-padded and the back was prepped and draped in a standard fashion.  Timeout was taken confirming patient procedure and all other important data.  2 needles were placed into the back to identify the L4 and L5 pedicles.  Once identified the L4 pedicle I then marked out my incision.  This is a standard Wiltsie incision approximately half fingerbreadths off of midline.  I infiltrated the proposed incision site with quarter percent Marcaine with epinephrine and then made a incision.  About an inch and a half in length and sharp dissection was carried out down to the deep fascia.  The deep fascia was sharply incised and I bluntly dissected through the paraspinal muscles until I could palpate it site palpated the facet complex I then put in the sequential dilators of the Jesus retractor.  I confirmed that I was at the lateral aspect of the L4 foramen with a second  x-ray.  Once this was done I dilated up and then placed my retracting blades in.  Clearly visualized the pars of L4 and the inferior aspect of the L3-4 facet complex.  I obtained hemostasis using bipolar cautery and then placed a Penfield 4 into the L4 foramen.  A third and final x-ray was taken confirming that my Penfield 4 was at the L4 foramen.  Using a 3 mm Kerrison rongeur I took down a lateral portion of the pars.  I took about 3 mm of the lateral aspect of the pars down.  I then released the ligamentum flavum and exposed the L4 nerve root.  Once I had the L4 nerve root exposed and protected I could then dissect inferiorly to that at the level of the disc space posterior that identified the disc herniation.  I incised the annulus with a 15 blade scalpel and then used a nerve hook to mobilize the fragment and then I excised it with a micropituitary rongeur.  Fragments of disc material were removed.  This was consistent with the size of the disc fragment seen on the MRI.  Once these 2 were removed the L4 nerve root was no longer displaced and was no longer under tension.  Nightly use my long nerve hook to palpate superiorly inferiorly and out the along the L4 nerve root.  The nerve root itself was also free of the at this point I irrigated the wound copiously normal saline and obtained hemostasis using bipolar cautery as well as FloSeal I did not inject about 1 cc of Depo-Medrol (40 mg) for postoperative analgesia.  A thrombin-soaked Gelfoam patties was placed over  the foramenotomy.  I then closed the wound in a layered fashion with interrupted #1 Vicryl suture, 2-0 Vicryl suture, and 3-0 Monocryl. Steri-Strips and a dry dressing were applied and the patient was ultimately extubated transferred the PACU without incident.  The end of the case all needle sponge counts were correct.

## 2017-06-10 NOTE — Anesthesia Preprocedure Evaluation (Addendum)
Anesthesia Evaluation  Patient identified by MRN, date of birth, ID band Patient awake    Reviewed: Allergy & Precautions, NPO status , Patient's Chart, lab work & pertinent test results  Airway Mallampati: II  TM Distance: >3 FB     Dental   Pulmonary neg pulmonary ROS,    breath sounds clear to auscultation       Cardiovascular + Peripheral Vascular Disease   Rhythm:Regular Rate:Normal     Neuro/Psych    GI/Hepatic negative GI ROS, Neg liver ROS,   Endo/Other  diabetes  Renal/GU      Musculoskeletal   Abdominal   Peds  Hematology   Anesthesia Other Findings   Reproductive/Obstetrics                             Anesthesia Physical Anesthesia Plan  ASA: III  Anesthesia Plan: General   Post-op Pain Management:    Induction: Intravenous  PONV Risk Score and Plan: 2 and Treatment may vary due to age or medical condition  Airway Management Planned: Oral ETT  Additional Equipment:   Intra-op Plan:   Post-operative Plan: Extubation in OR  Informed Consent: I have reviewed the patients History and Physical, chart, labs and discussed the procedure including the risks, benefits and alternatives for the proposed anesthesia with the patient or authorized representative who has indicated his/her understanding and acceptance.   Dental advisory given  Plan Discussed with: Anesthesiologist and CRNA  Anesthesia Plan Comments:         Anesthesia Quick Evaluation

## 2017-06-10 NOTE — Transfer of Care (Signed)
Immediate Anesthesia Transfer of Care Note  Patient: Christopher Woods.  Procedure(s) Performed: L4-5 disectomy left far lateral (Left )  Patient Location: PACU  Anesthesia Type:General  Level of Consciousness: drowsy  Airway & Oxygen Therapy: Patient Spontanous Breathing and Patient connected to face mask oxygen  Post-op Assessment: Report given to RN and Post -op Vital signs reviewed and stable  Post vital signs: Reviewed and stable  Last Vitals:  Vitals:   06/10/17 0859  BP: 133/81  Pulse: 63  Resp: 18  Temp: 36.4 C  SpO2: 100%    Last Pain:  Vitals:   06/10/17 0924  TempSrc:   PainSc: 6       Patients Stated Pain Goal: 4 (58/25/18 9842)  Complications: No apparent anesthesia complications

## 2017-06-10 NOTE — Discharge Instructions (Signed)
Lumbar Diskectomy, Care After °Refer to this sheet in the next few weeks. These instructions provide you with information about caring for yourself after your procedure. Your health care provider may also give you more specific instructions. Your treatment has been planned according to current medical practices, but problems sometimes occur. Call your health care provider if you have any problems or questions after your procedure. °What can I expect after the procedure? °After the procedure, it is common to have: °· Pain. °· Numbness. °· Weakness. ° °Follow these instructions at home: °Medicines °· Take medicines only as directed by your health care provider. °· If you were prescribed an antibiotic medicine, finish all of it even if you start to feel better. °Incision care °· There are many different ways to close and cover an incision, including stitches (sutures), skin glue, and adhesive strips. Follow your health care provider's instructions about: °? Incision care. °? Bandage (dressing) changes and removal. °? Incision closure removal. °· Check your incision area every day for signs of infection. If you cannot see your incision, have someone check it for you. Watch for: °? Redness, swelling, or pain. °? Fluid, blood, or pus. °Activity °· Avoid sitting for longer than 20 minutes at a time or as directed by your health care provider. °· Do not climb stairs more than once each day until your health care provider approves. °· Do not bend at your waist. To pick things up, bend your knees. °· Do not lift anything that is heavier than 10 lb (4.5 kg) or as directed by your health care provider. °· Do not drive a car until your health care provider approves. °· Ask your health care provider when you may return to your normal activities, such as playing sports and going back to work. °· Work with your physical therapist to learn safe movement and exercises to help healing. Do these exercises as directed. °· Take short  walks often. °General instructions °· Do not use any tobacco products, including cigarettes, chewing tobacco, or electronic cigarettes. If you need help quitting, ask your health care provider. °· Follow your health care provider’s instructions about bathing. Do not take baths, shower, swim, or use a hot tub until your health care provider approves. °· Wear your back brace as directed by your health care provider. °· To prevent constipation: °? Drink enough fluid to keep your urine clear or pale yellow. °? Eat plenty of fruits, vegetables, and whole grains. °· Keep all follow-up visits as directed by your health care provider. This is important. This includes any follow-up visits with your physical therapist. °Contact a health care provider if: °· You have a fever. °· You have redness, swelling, or pain in your incision area. °· Your pain is not controlled with medicine. °· You have pain, numbness, or weakness that lasts longer than three weeks after surgery. °· You become constipated. °Get help right away if: °· You have fluid, blood, or pus coming from your incision. °· You have increasing pain, numbness, or weakness. °· You lose control of when you urinate or have a bowel movement (incontinence). °· You have chest pain. °· You have trouble breathing. °This information is not intended to replace advice given to you by your health care provider. Make sure you discuss any questions you have with your health care provider. °Document Released: 04/15/2004 Document Revised: 10/17/2015 Document Reviewed: 01/03/2014 °Elsevier Interactive Patient Education © 2018 [employer] ° °

## 2017-06-10 NOTE — H&P (Signed)
Christopher Haven, MD Chief Complaint: Severe back buttock and left radicular leg pain with neurological deficits. History: Patient has approximately a 2-1/42-month history of progressive debilitating back buttock and neuropathic left leg pain.  Symptoms began in the beginning of November.  He has noticed progressive debilitating pain that has been unsuccessfully managed with injection therapy, and medications.  As a result of the progressive neurological deficits and the severe pain we have elected to move forward with surgical discectomy and decompression.  Past Medical History:  Diagnosis Date  . Arthritis   . Dupuytren's disease   . History of kidney stones   . Lumbago   . MVP (mitral valve prolapse)   . Trigeminal neuralgia    resolved  . Type 2 diabetes mellitus without complication ([facility] 95/18/8416   Allergies  Allergen Reactions  . Indomethacin Rash    No current facility-administered medications on file prior to encounter.    Current Outpatient Medications on File Prior to Encounter  Medication Sig Dispense Refill  . aspirin EC 81 MG tablet Take 81 mg by mouth daily.    .Marland Kitchengabapentin (NEURONTIN) 300 MG capsule Take 300 mg by mouth 2 (two) times daily.    . metFORMIN (GLUCOPHAGE) 500 MG tablet Take 1 tablet (500 mg total) by mouth at bedtime. 90 tablet 1  . Multiple Vitamin (MULTIVITAMIN) tablet Take 1 tablet by mouth daily.    . naproxen sodium (ALEVE) 220 MG tablet Take 220 mg by mouth daily as needed (pain).    . Omega-3 Fatty Acids (FISH OIL) 1200 MG CAPS Take 1,200 mg by mouth daily.    . blood glucose meter kit and supplies KIT Dispense freestyle light. Use two times daily as directed. (FOR ICD-9 250.00, 250.01). 1 each 0    Physical Exam: Vitals:   06/10/17 0859  BP: 133/81  Pulse: 63  Resp: 18  Temp: 97.6 F (36.4 C)  SpO2: 100%   There is no height or weight on file to calculate BMI.   Clinical exam: He is alert he is oriented 3. No shortness of  breath or chest pain. Lungs clear to auscultation no rubs gallops murmurs. Abdomen: Soft nontender. No incontinence of bowel and bladder, no rebound tenderness. Altered gait pattern due to horrific left radicular leg pain. Positive femoral stretch test left side. Positive straight leg raise test left side reproduction of pain diffusely in the calf. No significant hip, knee, ankle pain with isolated joint range of motion. 2 out of 5 EHL/tibialis anterior strength left side, 4+ out of 5 gastrocnemius strength on the left side. 5 out of 5 strength on the right lower extremity. Compartments are soft and nontender, intact peripheral pulses bilaterally. Obvious skin lesions abrasions and contusions to the thoracolumbar spine. No previous surgeries to his thoracolumbar spine.  MRI of his lumbar spine April 14, 2017 was reviewed. I agree with the radiologist he has a far lateral L4-5 disc herniation with cranial migration. There is prominent dorsal displacement of the exiting L4 nerve root on that left side. Disc extrusion at L4-5 measuring 8 x 12 x 10 as well as facet disease prominent displacement of the exiting left L4 nerve root is notedNo disc herniation or stenosis at L5-S1. Mild degenerative disc disease at L3-4. Mild degenerative disease at L2-3 slight neural foraminal narrowing.  X-rays of the lumbar spine April 14, 2017 demonstrate loss of lumbar lordosis. Slight scoliosis (most likely postural). No spondylolisthesis noted.  Image: Ct Cardiac Scoring  Addendum Date:  05/21/2017   ADDENDUM REPORT: 05/21/2017 10:14 CLINICAL DATA:  Risk stratification EXAM: Coronary Calcium Score TECHNIQUE: The patient was scanned on a Siemens Somatom 64 slice scanner. Axial non-contrast 3 mm slices were carried out through the heart. The data set was analyzed on a dedicated work station and scored using the [place]. FINDINGS: Non-cardiac: See separate report from [facility].  Ascending Aorta:  Normal 3.2 cm Pericardium: Normal Coronary arteries: Calcium noted in ostial LAD and circumflex as well as distal LAD IMPRESSION: Coronary calcium score of 100. This was 76 th percentile for age and sex matched control. Jenkins Rouge Electronically Signed   By: Jenkins Rouge M.D.   On: 05/21/2017 10:14   Result Date: 05/21/2017 EXAM: OVER-READ INTERPRETATION  CT CHEST The following report is an over-read performed by radiologist Dr. Collene Leyden [facility], [place] on 05/17/2017. This over-read does not include interpretation of cardiac or coronary anatomy or pathology. The coronary calcium score interpretation by the cardiologist is attached. COMPARISON:  None. FINDINGS: Vascular: Heart is normal size.  Aorta is normal caliber. Mediastinum/Nodes: No adenopathy in the lower mediastinum or hila. Lungs/Pleura: Visualized lungs are clear.  No effusions. Upper Abdomen: Imaging into the upper abdomen shows no acute findings. Musculoskeletal: Chest wall soft tissues are unremarkable. No acute bony abnormality. IMPRESSION: No acute or significant extracardiac abnormality. Electronically Signed: By: Rolm Baptise M.D. On: 05/17/2017 09:45    A/P: At this point time I had a long discussion with the patient and his daughter. Despite injection therapy observation and self-directed exercises he continues to have a left foot drop, significant neuropathic left leg pain, and an MRI consistent with acute L4 neural compression from a far lateral disc herniation on the left side. At this point time we could consider a left L4 selective nerve root block but he is truly tired of this pain and would like to move forward with surgery. Given the neurological deficits I think it is reasonable. The surgery of choice would be a far lateral discectomy.   I have gone over the risks of this with the patient and his daughter and these include injury to the dorsal root ganglion which would lead to a complex  regional pain syndrome (sympathetic pain) which could require further treatment such as a spinal cord stimulator. Risks also include: Infection, bleeding, death, stroke, paralysis, ongoing or worse pain, need for additional surgery, leak of spinal fluid, adjacent segment degeneration requiring additional surgery, post-operative hematoma formation that can result in neurological compromise and the need for urgent/emergent re-operation. Loss in bowel and bladder control. Injury to major vessels that could result in the need for urgent abdominal surgery to stop bleeding. Risk of deep venous thrombosis (DVT) and the need for additional treatment. Recurrent disc herniation resulting in the need for revision surgery, which could include fusion surgery (utilizing instrumentation such as pedicle screws and intervertebral cages).   Patient continues to have a foot drop.  There is been no significant change in his clinical exam.  Plan on moving forward with the left far lateral L4-5 discectomy.

## 2017-06-10 NOTE — Progress Notes (Signed)
Pt states his l leg feels significantly better than preop/ strength is 5/5, has been wearung a brace on l  Foot for foot drop.

## 2017-06-11 ENCOUNTER — Encounter (HOSPITAL_COMMUNITY): Payer: Self-pay | Admitting: Orthopedic Surgery

## 2017-06-11 DIAGNOSIS — M720 Palmar fascial fibromatosis [Dupuytren]: Secondary | ICD-10-CM | POA: Diagnosis not present

## 2017-06-11 DIAGNOSIS — Z7984 Long term (current) use of oral hypoglycemic drugs: Secondary | ICD-10-CM | POA: Diagnosis not present

## 2017-06-11 DIAGNOSIS — E119 Type 2 diabetes mellitus without complications: Secondary | ICD-10-CM | POA: Diagnosis not present

## 2017-06-11 DIAGNOSIS — M5116 Intervertebral disc disorders with radiculopathy, lumbar region: Secondary | ICD-10-CM | POA: Diagnosis not present

## 2017-06-11 DIAGNOSIS — Z79899 Other long term (current) drug therapy: Secondary | ICD-10-CM | POA: Diagnosis not present

## 2017-06-11 DIAGNOSIS — Z8249 Family history of ischemic heart disease and other diseases of the circulatory system: Secondary | ICD-10-CM | POA: Diagnosis not present

## 2017-06-11 DIAGNOSIS — M21372 Foot drop, left foot: Secondary | ICD-10-CM | POA: Diagnosis not present

## 2017-06-11 DIAGNOSIS — I341 Nonrheumatic mitral (valve) prolapse: Secondary | ICD-10-CM | POA: Diagnosis not present

## 2017-06-11 DIAGNOSIS — Z87442 Personal history of urinary calculi: Secondary | ICD-10-CM | POA: Diagnosis not present

## 2017-06-11 DIAGNOSIS — Z808 Family history of malignant neoplasm of other organs or systems: Secondary | ICD-10-CM | POA: Diagnosis not present

## 2017-06-11 DIAGNOSIS — Z9889 Other specified postprocedural states: Secondary | ICD-10-CM | POA: Diagnosis not present

## 2017-06-11 DIAGNOSIS — Z888 Allergy status to other drugs, medicaments and biological substances status: Secondary | ICD-10-CM | POA: Diagnosis not present

## 2017-06-11 DIAGNOSIS — Z7982 Long term (current) use of aspirin: Secondary | ICD-10-CM | POA: Diagnosis not present

## 2017-06-11 DIAGNOSIS — Z8042 Family history of malignant neoplasm of prostate: Secondary | ICD-10-CM | POA: Diagnosis not present

## 2017-06-11 LAB — GLUCOSE, CAPILLARY: Glucose-Capillary: 121 mg/dL — ABNORMAL HIGH (ref 65–99)

## 2017-06-11 NOTE — Progress Notes (Signed)
    Subjective: Procedure(s) (LRB): L4-5 disectomy left far lateral (Left) 1 Day Post-Op  Patient reports pain as 1 on 0-10 scale.  Reports none leg pain reports incisional back pain   Positive void Negative bowel movement Positive flatus Negative chest pain or shortness of breath  Objective: Vital signs in last 24 hours: Temp:  [97 F (36.1 C)-98.2 F (36.8 C)] 97.9 F (36.6 C) (01/18 0400) Pulse Rate:  [63-81] 68 (01/18 0400) Resp:  [10-21] 20 (01/18 0400) BP: (114-143)/(57-86) 119/57 (01/18 0400) SpO2:  [98 %-100 %] 99 % (01/18 0400)  Intake/Output from previous day: 01/17 0701 - 01/18 0700 In: 1080 [P.O.:480; I.V.:600] Out: -   Labs: Recent Labs    06/08/17 0818  WBC 5.1  RBC 4.77  HCT 44.6  PLT 248   Recent Labs    06/08/17 0818  NA 138  K 4.3  CL 103  CO2 27  BUN 16  CREATININE 1.05  GLUCOSE 132*  CALCIUM 9.5   No results for input(s): LABPT, INR in the last 72 hours.  Physical Exam: Neurologically intact ABD soft Intact pulses distally Incision: dressing C/D/I Compartment soft There is no height or weight on file to calculate BMI.   Assessment/Plan: Patient stable  xrays n/a Continue mobilization with physical therapy Continue care  Advance diet Up with therapy  Ok for d/c to home - doing well    Melina Schools, MD [place] [phone]

## 2017-06-11 NOTE — Evaluation (Signed)
Occupational Therapy Evaluation and Discharge Patient Details Name: Christopher Woods. MRN: [id number] DOB: May 20, 1956 Today's Date: 06/11/2017    History of Present Illness Pt is a 62 y/o male who presents s/p L4-L5 laminectomy/decompression on 06/10/17.   Clinical Impression   Pt reports he was independent with ADL PTA. Currently pt supervision with ADL and functional mobility. All back, safety, and ADL education completed with pt and wife. Pt planning to d/c home with 24/7 supervision from family initially then PRN. No further acute OT needs identified; signing off at this time. Please re-consult if needs change. Thank you for this referral.     Follow Up Recommendations  No OT follow up;Supervision - Intermittent    Equipment Recommendations  None recommended by OT    Recommendations for Other Services       Precautions / Restrictions Precautions Precautions: Fall;Back Precaution Booklet Issued: No Precaution Comments: Pt able to recall 3/3 back precautions. Required Braces or Orthoses: Spinal Brace Spinal Brace: Lumbar corset;Applied in sitting position Restrictions Weight Bearing Restrictions: No      Mobility Bed Mobility               General bed mobility comments: Pt sitting EOB upon arrival.  Transfers Overall transfer level: Needs assistance Equipment used: None Transfers: Sit to/from Stand Sit to Stand: Supervision         General transfer comment: for safety. Good hand placement and technique.    Balance Overall balance assessment: Needs assistance Sitting-balance support: Feet supported;No upper extremity supported Sitting balance-Leahy Scale: Good     Standing balance support: No upper extremity supported;During functional activity Standing balance-Leahy Scale: Good                             ADL either performed or assessed with clinical judgement   ADL Overall ADL's : Needs assistance/impaired Eating/Feeding: Set  up;Sitting   Grooming: Supervision/safety;Standing Grooming Details (indicate cue type and reason): Educated on use of 2 cups for oral care Upper Body Bathing: Set up;Sitting   Lower Body Bathing: Supervison/ safety;Sit to/from stand   Upper Body Dressing : Set up;Sitting Upper Body Dressing Details (indicate cue type and reason): Pt reports he is able to don brace without assist and has no questions with managing Lower Body Dressing: Supervision/safety;Sit to/from stand Lower Body Dressing Details (indicate cue type and reason): Educated on compensatory strategies for LB ADL. Pt able to cross foot over opposite knee Toilet Transfer: Supervision/safety;Ambulation Toilet Transfer Details (indicate cue type and reason): Recommend use of toilet riser initially   Toileting - Clothing Manipulation Details (indicate cue type and reason): Educated on proper technique for peri care without twisting and use of wet wipes Tub/ Shower Transfer: Supervision/safety;Walk-in shower;Ambulation;Shower Scientist, research (medical) Details (indicate cue type and reason): Educated on use of shower seat for safety with bathing and supervision initially for shower transfers Functional mobility during ADLs: Supervision/safety General ADL Comments: Educated pt on maintaining back precuations during functional activities, keeping frequently used items at counter top height, frequent mobility thorughout the day upon return home.     Vision         Perception     Praxis      Pertinent Vitals/Pain Pain Assessment: Faces Faces Pain Scale: Hurts little more Pain Location: back Pain Descriptors / Indicators: Sore Pain Intervention(s): Monitored during session     Hand Dominance     Extremity/Trunk Assessment Upper Extremity Assessment Upper Extremity  Assessment: Overall WFL for tasks assessed   Lower Extremity Assessment Lower Extremity Assessment: Defer to PT evaluation   Cervical / Trunk  Assessment Cervical / Trunk Assessment: Other exceptions Cervical / Trunk Exceptions: s/p spinal surgery   Communication Communication Communication: No difficulties   Cognition Arousal/Alertness: Awake/alert Behavior During Therapy: WFL for tasks assessed/performed Overall Cognitive Status: Within Functional Limits for tasks assessed                                     General Comments       Exercises     Shoulder Instructions      Home Living Family/patient expects to be discharged to:: Private residence Living Arrangements: Spouse/significant other Available Help at Discharge: Family;Available 24 hours/day(until Tuesday) Type of Home: House Home Access: Stairs to enter [employer] of Steps: 6 Entrance Stairs-Rails: Right;Left Home Layout: One level     Bathroom Shower/Tub: Occupational psychologist: Standard     Home Equipment: Toilet riser;Shower seat          Prior Functioning/Environment Level of Independence: Independent                 OT Problem List:        OT Treatment/Interventions:      OT Goals(Current goals can be found in the care plan section) Acute Rehab OT Goals Patient Stated Goal: Home today OT Goal Formulation: All assessment and education complete, DC therapy  OT Frequency:     Barriers to D/C:            Co-evaluation              AM-PAC PT "6 Clicks" Daily Activity     Outcome Measure Help from another person eating meals?: None Help from another person taking care of personal grooming?: A Little Help from another person toileting, which includes using toliet, bedpan, or urinal?: A Little Help from another person bathing (including washing, rinsing, drying)?: A Little Help from another person to put on and taking off regular upper body clothing?: None Help from another person to put on and taking off regular lower body clothing?: A Little 6 Click Score: 20   End of Session  Equipment Utilized During Treatment: Back brace Nurse Communication: Mobility status;Other (comment)(no equipment or f/u needs)  Activity Tolerance: Patient tolerated treatment well Patient left: with call bell/phone within reach;with family/visitor present;Other (comment)(sitting EOB)  OT Visit Diagnosis: Unsteadiness on feet (R26.81);Pain Pain - part of body: (back)                Time: 2703-5009 OT Time Calculation (min): 11 min Charges:  OT General Charges $OT Visit: 1 Visit OT Evaluation $OT Eval Low Complexity: 1 Low G-Codes:     Vickii Volland A. Ulice Brilliant, M.S., OTR/L Pager: [facility] 06/11/2017, 10:08 AM

## 2017-06-11 NOTE — Evaluation (Signed)
Physical Therapy Evaluation Patient Details Name: Christopher Woods. MRN: [id number] DOB: July 03, 1955 Today's Date: 06/11/2017   History of Present Illness  Pt is a 62 y/o male who presents s/p L4-L5 laminectomy/decompression on 06/10/17.  Clinical Impression  Pt admitted with above diagnosis. Pt currently with functional limitations due to the deficits listed below (see PT Problem List). At the time of PT eval pt was able to perform transfers and ambulation with gross min guard assist for balance support and safety. Pt and wife were educated on brace application/wearing schedule, precautions, walking program, activity progression and car transfer. Pt will benefit from skilled PT to increase their independence and safety with mobility to allow discharge to the venue listed below.    Follow Up Recommendations No PT follow up;Supervision for mobility/OOB    Equipment Recommendations  None recommended by PT    Recommendations for Other Services       Precautions / Restrictions Precautions Precautions: Fall;Back Precaution Booklet Issued: Yes (comment) Precaution Comments: Reviewed handout. Pt was cued for precautions during functional mobility.  Required Braces or Orthoses: Spinal Brace Spinal Brace: Lumbar corset;Applied in sitting position Restrictions Weight Bearing Restrictions: No      Mobility  Bed Mobility               General bed mobility comments: Pt sitting EOB when PT arrived. Verbally reviewed log roll technique.   Transfers Overall transfer level: Needs assistance Equipment used: None Transfers: Sit to/from Stand Sit to Stand: Supervision         General transfer comment: Close supervision for safety. Pt was able to power-up to full stand without assistance.   Ambulation/Gait Ambulation/Gait assistance: Supervision Ambulation Distance (Feet): 250 Feet Assistive device: None Gait Pattern/deviations: Step-through pattern;Decreased stride length;Trunk  flexed Gait velocity: Decreased Gait velocity interpretation: Below normal speed for age/gender General Gait Details: VC's for improved posture and general safety. Pt was able to make corrective changes however unable to maintain those changes.   Stairs Stairs: Yes Stairs assistance: Min guard Stair Management: One rail Right;Step to pattern;Forwards Number of Stairs: 6 General stair comments: Close guard for safety. Pt was cued for sequencing and general safety.   Wheelchair Mobility    Modified Rankin (Stroke Patients Only)       Balance Overall balance assessment: Needs assistance Sitting-balance support: Feet supported;No upper extremity supported Sitting balance-Leahy Scale: Fair     Standing balance support: No upper extremity supported;During functional activity Standing balance-Leahy Scale: Fair                               Pertinent Vitals/Pain Pain Assessment: Faces Faces Pain Scale: Hurts little more Pain Location: Incision site Pain Descriptors / Indicators: Operative site guarding Pain Intervention(s): Limited activity within patient's tolerance;Monitored during session;Repositioned    Home Living Family/patient expects to be discharged to:: Private residence Living Arrangements: Spouse/significant other Available Help at Discharge: Family;Available 24 hours/day(Over the weekend, then PRN) Type of Home: House Home Access: Stairs to enter Entrance Stairs-Rails: Psychiatric nurse of Steps: 6 Home Layout: One level Home Equipment: Toilet riser;Shower seat      Prior Function Level of Independence: Independent               Hand Dominance        Extremity/Trunk Assessment   Upper Extremity Assessment Upper Extremity Assessment: Overall WFL for tasks assessed    Lower Extremity Assessment Lower Extremity Assessment: Generalized  weakness(Consistent with pre-op diagnosis)    Cervical / Trunk  Assessment Cervical / Trunk Assessment: Other exceptions Cervical / Trunk Exceptions: s/p surgery  Communication   Communication: No difficulties  Cognition Arousal/Alertness: Awake/alert Behavior During Therapy: WFL for tasks assessed/performed Overall Cognitive Status: Within Functional Limits for tasks assessed                                        General Comments      Exercises     Assessment/Plan    PT Assessment Patient needs continued PT services  PT Problem List Decreased strength;Decreased range of motion;Decreased activity tolerance;Decreased balance;Decreased mobility;Decreased knowledge of use of DME;Decreased knowledge of precautions;Decreased safety awareness;Pain       PT Treatment Interventions DME instruction;Gait training;Stair training;Functional mobility training;Therapeutic activities;Therapeutic exercise;Neuromuscular re-education;Patient/family education    PT Goals (Current goals can be found in the Care Plan section)  Acute Rehab PT Goals Patient Stated Goal: Home today PT Goal Formulation: With patient Time For Goal Achievement: 06/18/17 Potential to Achieve Goals: Good    Frequency Min 5X/week   Barriers to discharge        Co-evaluation               AM-PAC PT "6 Clicks" Daily Activity  Outcome Measure Difficulty turning over in bed (including adjusting bedclothes, sheets and blankets)?: None Difficulty moving from lying on back to sitting on the side of the bed? : A Little Difficulty sitting down on and standing up from a chair with arms (e.g., wheelchair, bedside commode, etc,.)?: A Little Help needed moving to and from a bed to chair (including a wheelchair)?: A Little Help needed walking in hospital room?: A Little Help needed climbing 3-5 steps with a railing? : A Little 6 Click Score: 19    End of Session Equipment Utilized During Treatment: Gait belt;Back brace Activity Tolerance: Patient tolerated  treatment well Patient left: in bed;with call bell/phone within reach;with family/visitor present(Sitting EOB) Nurse Communication: Mobility status PT Visit Diagnosis: Unsteadiness on feet (R26.81);Pain;Other symptoms and signs involving the nervous system (R29.898) Pain - part of body: (Back)    Time: 2229-7989 PT Time Calculation (min) (ACUTE ONLY): 23 min   Charges:   PT Evaluation $PT Eval Moderate Complexity: 1 Mod PT Treatments $Gait Training: 8-22 mins   PT G Codes:        Rolinda Roan, PT, DPT Acute Rehabilitation Services Pager: [phone]   Thelma Comp 06/11/2017, 9:59 AM

## 2017-06-11 NOTE — Progress Notes (Signed)
Patient alert and oriented, mae's well, voiding adequate amount of urine, swallowing without difficulty, no c/o pain at time of discharge. Patient discharged home with family. Script and discharged instructions given to patient. Patient and family stated understanding of instructions given. Patient has an appointment with Dr. Brooks  

## 2017-06-16 NOTE — Discharge Summary (Signed)
Physician Discharge Summary  Patient ID: Christopher Woods. MRN: [id number] DOB/AGE: [birth year] 62 y.o.  Admit date: 06/10/2017 Discharge date: 06/11/17  Admission Diagnoses:  L4-5 Disc herniation left lateral  Discharge Diagnoses:  Active Problems:   Status post lumbar spine surgery for decompression of spinal cord   Past Medical History:  Diagnosis Date  . Arthritis   . Dupuytren's disease   . History of kidney stones   . Lumbago   . MVP (mitral valve prolapse)   . Trigeminal neuralgia    resolved  . Type 2 diabetes mellitus without complication ([facility]) 07/06/2480    Surgeries: Procedure(s): L4-5 disectomy left far lateral on 06/10/2017   Consultants (if any):   Discharged Condition: Improved  Hospital Course: Christopher Woods. is an 62 y.o. male who was admitted 06/10/2017 with a diagnosis of Left lateral L4-5 disc herniation and went to the operating room on 06/10/2017 and underwent the above named procedures. Post op day one pt reports a low level of pain controlled on oral medication.  Pt reports decreased leg pain.  Denies difficulty with urination.  Pt is ambulating in hallway.  Pt is cleared by PT before DC.    He was given perioperative antibiotics:  Anti-infectives (From admission, onward)   Start     Dose/Rate Route Frequency Ordered Stop   06/10/17 2000  ceFAZolin (ANCEF) IVPB 2g/100 mL premix     2 g 200 mL/hr over 30 Minutes Intravenous Every 8 hours 06/10/17 1614 06/11/17 0400   06/10/17 0913  ceFAZolin (ANCEF) 2-4 GM/100ML-% IVPB    Comments:  Nyoka Cowden   : cabinet override      06/10/17 0913 06/10/17 1137   06/10/17 0853  ceFAZolin (ANCEF) IVPB 2g/100 mL premix     2 g 200 mL/hr over 30 Minutes Intravenous 30 min pre-op 06/10/17 0853 06/10/17 1207    .  He was given sequential compression devices, early ambulation, and TED for DVT prophylaxis.  He benefited maximally from the hospital stay and there were no complications.    Recent  vital signs:  Vitals:   06/11/17 0400 06/11/17 0825  BP: (!) 119/57 121/77  Pulse: 68 75  Resp: 20 18  Temp: 97.9 F (36.6 C) 98.2 F (36.8 C)  SpO2: 99% 100%    Recent laboratory studies:  Lab Results  Component Value Date   HGB 15.0 06/08/2017   HGB 14.9 05/13/2017   HGB 14.0 02/26/2016   Lab Results  Component Value Date   WBC 5.1 06/08/2017   PLT 248 06/08/2017   No results found for: INR Lab Results  Component Value Date   NA 138 06/08/2017   K 4.3 06/08/2017   CL 103 06/08/2017   CO2 27 06/08/2017   BUN 16 06/08/2017   CREATININE 1.05 06/08/2017   GLUCOSE 132 (H) 06/08/2017    Discharge Medications:   Allergies as of 06/11/2017      Reactions   Indomethacin Rash      Medication List    STOP taking these medications   aspirin EC 81 MG tablet   HYDROcodone-acetaminophen 5-325 MG tablet Commonly known as:  NORCO/VICODIN   naproxen sodium 220 MG tablet Commonly known as:  ALEVE     TAKE these medications   blood glucose meter kit and supplies Kit Dispense freestyle light. Use two times daily as directed. (FOR ICD-9 250.00, 250.01).   ezetimibe 10 MG tablet Commonly known as:  ZETIA Take 1 tablet (10 mg total) by  mouth daily.   Fish Oil 1200 MG Caps Take 1,200 mg by mouth daily.   gabapentin 300 MG capsule Commonly known as:  NEURONTIN Take 300 mg by mouth 2 (two) times daily.   metFORMIN 500 MG tablet Commonly known as:  GLUCOPHAGE Take 1 tablet (500 mg total) by mouth at bedtime.   methocarbamol 500 MG tablet Commonly known as:  ROBAXIN Take 1 tablet (500 mg total) by mouth 3 (three) times daily.   multivitamin tablet Take 1 tablet by mouth daily.   ondansetron 4 MG disintegrating tablet Commonly known as:  ZOFRAN ODT Take 1 tablet (4 mg total) by mouth every 8 (eight) hours as needed for nausea or vomiting.   oxyCODONE-acetaminophen 10-325 MG tablet Commonly known as:  PERCOCET Take 1 tablet by mouth every 4 (four) hours as  needed for pain.   rosuvastatin 10 MG tablet Commonly known as:  CRESTOR Take 1 tablet (10 mg total) by mouth daily. What changed:  when to take this       Diagnostic Studies: Dg Lumbar Spine 2-3 Views  Result Date: 06/10/2017 CLINICAL DATA:  L4-L5 discectomy. EXAM: LUMBAR SPINE - 2-3 VIEW COMPARISON:  MRI 06/02/2011. FINDINGS: Lumbar spine numbered with the lowest segmented appearing lumbar shaped vertebrae on lateral view as L5. Metallic markers are noted posteriorly along the lower portion of L3 in the midportion of L4. IMPRESSION: Lumbar spine numbered with the lowest segmented appearing lumbar shaped vertebrae on lateral view as L5. Metallic markers noted at L3 and L4 posteriorly. Electronically Signed   By: Marcello Moores  Register   On: 06/10/2017 13:10   Dg Spine Portable 1 View  Result Date: 06/10/2017 CLINICAL DATA:  Localization radiograph labeled #3. The patient is undergoing L4-5 discectomy. EXAM: PORTABLE SPINE - 1 VIEW COMPARISON:  Earlier localization radiographs of today's date FINDINGS: The metallic probe projects approximately 8 mm posterior to the lower posterior cortex of the L4 vertebral body. IMPRESSION: The metallic probe projects 8 mm posterior to the lower third of the posterior cortex of the L4 vertebral body. Electronically Signed   By: David  Martinique M.D.   On: 06/10/2017 12:44    Disposition: 01-Home or Self Care Pt will present to clinic in 2 weeks Post op meds provided Discharge Instructions    Incentive spirometry RT   Complete by:  As directed       Follow-up Information    Melina Schools, MD Follow up in 2 week(s).   Specialty:  Orthopedic Surgery Contact information: [address] [place] [address] [phone]            Signed: Valinda Hoar 06/16/2017, 10:24 AM

## 2017-07-26 DIAGNOSIS — M545 Low back pain: Secondary | ICD-10-CM | POA: Diagnosis not present

## 2017-07-26 DIAGNOSIS — Z4889 Encounter for other specified surgical aftercare: Secondary | ICD-10-CM | POA: Diagnosis not present

## 2017-07-28 DIAGNOSIS — Z4889 Encounter for other specified surgical aftercare: Secondary | ICD-10-CM | POA: Diagnosis not present

## 2017-07-28 DIAGNOSIS — M545 Low back pain: Secondary | ICD-10-CM | POA: Diagnosis not present

## 2017-08-02 DIAGNOSIS — M545 Low back pain: Secondary | ICD-10-CM | POA: Diagnosis not present

## 2017-08-02 DIAGNOSIS — Z4889 Encounter for other specified surgical aftercare: Secondary | ICD-10-CM | POA: Diagnosis not present

## 2017-08-03 ENCOUNTER — Other Ambulatory Visit (INDEPENDENT_AMBULATORY_CARE_PROVIDER_SITE_OTHER): Payer: BLUE CROSS/BLUE SHIELD

## 2017-08-03 DIAGNOSIS — E785 Hyperlipidemia, unspecified: Secondary | ICD-10-CM | POA: Diagnosis not present

## 2017-08-03 LAB — HEPATIC FUNCTION PANEL
ALT: 39 U/L (ref 0–53)
AST: 32 U/L (ref 0–37)
Albumin: 4 g/dL (ref 3.5–5.2)
Alkaline Phosphatase: 51 U/L (ref 39–117)
Bilirubin, Direct: 0.2 mg/dL (ref 0.0–0.3)
Total Bilirubin: 0.5 mg/dL (ref 0.2–1.2)
Total Protein: 6.7 g/dL (ref 6.0–8.3)

## 2017-08-03 LAB — LDL CHOLESTEROL, DIRECT: Direct LDL: 59 mg/dL

## 2017-08-04 DIAGNOSIS — Z4889 Encounter for other specified surgical aftercare: Secondary | ICD-10-CM | POA: Diagnosis not present

## 2017-08-04 DIAGNOSIS — M545 Low back pain: Secondary | ICD-10-CM | POA: Diagnosis not present

## 2017-08-08 ENCOUNTER — Other Ambulatory Visit: Payer: Self-pay | Admitting: Family Medicine

## 2017-08-10 DIAGNOSIS — M545 Low back pain: Secondary | ICD-10-CM | POA: Diagnosis not present

## 2017-08-10 DIAGNOSIS — Z4889 Encounter for other specified surgical aftercare: Secondary | ICD-10-CM | POA: Diagnosis not present

## 2017-08-12 DIAGNOSIS — M545 Low back pain: Secondary | ICD-10-CM | POA: Diagnosis not present

## 2017-08-12 DIAGNOSIS — Z4889 Encounter for other specified surgical aftercare: Secondary | ICD-10-CM | POA: Diagnosis not present

## 2017-08-17 DIAGNOSIS — M545 Low back pain: Secondary | ICD-10-CM | POA: Diagnosis not present

## 2017-08-17 DIAGNOSIS — Z4889 Encounter for other specified surgical aftercare: Secondary | ICD-10-CM | POA: Diagnosis not present

## 2017-08-19 DIAGNOSIS — M545 Low back pain: Secondary | ICD-10-CM | POA: Diagnosis not present

## 2017-08-19 DIAGNOSIS — Z4889 Encounter for other specified surgical aftercare: Secondary | ICD-10-CM | POA: Diagnosis not present

## 2017-08-24 DIAGNOSIS — M545 Low back pain: Secondary | ICD-10-CM | POA: Diagnosis not present

## 2017-08-24 DIAGNOSIS — Z4889 Encounter for other specified surgical aftercare: Secondary | ICD-10-CM | POA: Diagnosis not present

## 2017-08-26 DIAGNOSIS — M545 Low back pain: Secondary | ICD-10-CM | POA: Diagnosis not present

## 2017-08-26 DIAGNOSIS — Z4889 Encounter for other specified surgical aftercare: Secondary | ICD-10-CM | POA: Diagnosis not present

## 2017-08-31 DIAGNOSIS — Z4889 Encounter for other specified surgical aftercare: Secondary | ICD-10-CM | POA: Diagnosis not present

## 2017-08-31 DIAGNOSIS — M545 Low back pain: Secondary | ICD-10-CM | POA: Diagnosis not present

## 2017-09-02 DIAGNOSIS — M545 Low back pain: Secondary | ICD-10-CM | POA: Diagnosis not present

## 2017-09-02 DIAGNOSIS — Z4889 Encounter for other specified surgical aftercare: Secondary | ICD-10-CM | POA: Diagnosis not present

## 2017-09-03 ENCOUNTER — Telehealth: Payer: Self-pay | Admitting: Family Medicine

## 2017-09-03 DIAGNOSIS — E119 Type 2 diabetes mellitus without complications: Secondary | ICD-10-CM

## 2017-09-03 DIAGNOSIS — Z13220 Encounter for screening for lipoid disorders: Secondary | ICD-10-CM

## 2017-09-03 DIAGNOSIS — E7800 Pure hypercholesterolemia, unspecified: Secondary | ICD-10-CM

## 2017-09-03 DIAGNOSIS — Z125 Encounter for screening for malignant neoplasm of prostate: Secondary | ICD-10-CM

## 2017-09-03 NOTE — Telephone Encounter (Signed)
Copied from [place] [id number]. Topic: Inquiry >> Sep 03, 2017  1:14 PM Oliver Pila B wrote: Reason for CRM: pt would like labs created for his future cpe, call pt to advise

## 2017-09-03 NOTE — Telephone Encounter (Signed)
Please place future orders. Pt has CPE on 01/26/18

## 2017-09-04 NOTE — Addendum Note (Signed)
Addended by: Leone Haven on: 09/04/2017 09:53 AM   Modules accepted: Orders

## 2017-09-04 NOTE — Telephone Encounter (Signed)
Ordered. Please have him schedule an appointment for 2 days prior to the appointment.

## 2017-09-06 NOTE — Telephone Encounter (Signed)
Left message for pt to call back & schedule a fasting lab appt about 2 days prior to CPE on 01/26/18. Okay for PEC to schedule

## 2017-10-11 ENCOUNTER — Other Ambulatory Visit: Payer: Self-pay | Admitting: Family Medicine

## 2017-10-15 DIAGNOSIS — Z9089 Acquired absence of other organs: Secondary | ICD-10-CM | POA: Diagnosis not present

## 2017-10-15 DIAGNOSIS — M542 Cervicalgia: Secondary | ICD-10-CM | POA: Diagnosis not present

## 2017-10-15 DIAGNOSIS — M5416 Radiculopathy, lumbar region: Secondary | ICD-10-CM | POA: Diagnosis not present

## 2017-11-09 ENCOUNTER — Encounter: Payer: BLUE CROSS/BLUE SHIELD | Admitting: Family Medicine

## 2017-11-15 ENCOUNTER — Encounter: Payer: Self-pay | Admitting: Podiatry

## 2017-11-15 ENCOUNTER — Ambulatory Visit: Payer: BLUE CROSS/BLUE SHIELD | Admitting: Podiatry

## 2017-11-15 VITALS — BP 113/60 | HR 66 | Resp 16

## 2017-11-15 DIAGNOSIS — Q828 Other specified congenital malformations of skin: Secondary | ICD-10-CM

## 2017-11-15 NOTE — Progress Notes (Signed)
Subjective:  Patient ID: Christopher Robins., male    DOB: [birth year],  MRN: [id number] HPI Chief Complaint  Patient presents with  . Callouses    Plantar forefoot bilateral - tiny, callused areas x few months, had trimmed and treated here years ago  - they started to come back while recouperating from back surgery - wants treated again  . New Patient (Initial Visit)    62 y.o. male presents with the above complaint.   ROS: Denies fever chills nausea vomiting muscle aches pains calf pain back pain chest pain shortness of breath.  Past Medical History:  Diagnosis Date  . Arthritis   . Dupuytren's disease   . History of kidney stones   . Lumbago   . MVP (mitral valve prolapse)   . Trigeminal neuralgia    resolved  . Type 2 diabetes mellitus without complication ([facility]) 2/50/5397   Past Surgical History:  Procedure Laterality Date  . HAND SURGERY Right    thumb reconstruction  . HIP SURGERY Left   . HIP SURGERY Right   . KNEE SURGERY Left   . KNEE SURGERY Right    meniscal tear  . LUMBAR LAMINECTOMY/DECOMPRESSION MICRODISCECTOMY Left 06/10/2017   Procedure: L4-5 disectomy left far lateral;  Surgeon: Melina Schools, MD;  Location: [facility];  Service: Orthopedics;  Laterality: Left;  120 mins  . TRIGGER FINGER RELEASE Left     Current Outpatient Medications:  .  Ascorbic Acid (VITAMIN C PO), Take by mouth., Disp: , Rfl:  .  blood glucose meter kit and supplies KIT, Dispense freestyle light. Use two times daily as directed. (FOR ICD-9 250.00, 250.01)., Disp: 1 each, Rfl: 0 .  ezetimibe (ZETIA) 10 MG tablet, Take 1 tablet (10 mg total) by mouth daily., Disp: 90 tablet, Rfl: 3 .  gabapentin (NEURONTIN) 300 MG capsule, Take 300 mg by mouth 2 (two) times daily., Disp: , Rfl:  .  metFORMIN (GLUCOPHAGE) 500 MG tablet, TAKE 1 TABLET (500 MG TOTAL) BY MOUTH AT BEDTIME., Disp: 90 tablet, Rfl: 1 .  Multiple Vitamin (MULTIVITAMIN) tablet, Take 1 tablet by mouth daily., Disp: , Rfl:  .   Omega-3 Fatty Acids (FISH OIL) 1200 MG CAPS, Take 1,200 mg by mouth daily., Disp: , Rfl:  .  rosuvastatin (CRESTOR) 10 MG tablet, TAKE 1 TABLET BY MOUTH EVERY DAY, Disp: 90 tablet, Rfl: 1  Allergies  Allergen Reactions  . Indomethacin Rash   Review of Systems Objective:   Vitals:   11/15/17 1547  BP: 113/60  Pulse: 66  Resp: 16    General: Well developed, nourished, in no acute distress, alert and oriented x3   Dermatological: Skin is warm, dry and supple bilateral. Nails x 10 are well maintained; remaining integument appears unremarkable at this time. There are no open sores, no preulcerative lesions, no rash or signs of infection present.  Porokeratotic lesions plantar aspect of the forefoot bilateral.  No open lesions or wounds.  Vascular: Dorsalis Pedis artery and Posterior Tibial artery pedal pulses are 2/4 bilateral with immedate capillary fill time. Pedal hair growth present. No varicosities and no lower extremity edema present bilateral.   Neruologic: Grossly intact via light touch bilateral. Vibratory intact via tuning fork bilateral. Protective threshold with Semmes Wienstein monofilament intact to all pedal sites bilateral. Patellar and Achilles deep tendon reflexes 2+ bilateral. No Babinski or clonus noted bilateral.   Musculoskeletal: No gross boney pedal deformities bilateral. No pain, crepitus, or limitation noted with foot and ankle range of motion  bilateral. Muscular strength 5/5 in all groups tested bilateral.  Gait: Unassisted, Nonantalgic.    Radiographs:  None taken  Assessment & Plan:   Assessment: Porokeratosis plantar aspect bilateral foot.    Plan: Sharp debridement of porokeratosis follow-up with me on an as-needed basis.     Max T. [place], [place]

## 2017-12-14 DIAGNOSIS — M542 Cervicalgia: Secondary | ICD-10-CM | POA: Diagnosis not present

## 2017-12-14 DIAGNOSIS — M5030 Other cervical disc degeneration, unspecified cervical region: Secondary | ICD-10-CM | POA: Diagnosis not present

## 2017-12-14 DIAGNOSIS — G8929 Other chronic pain: Secondary | ICD-10-CM | POA: Insufficient documentation

## 2018-01-26 ENCOUNTER — Ambulatory Visit (INDEPENDENT_AMBULATORY_CARE_PROVIDER_SITE_OTHER): Payer: BLUE CROSS/BLUE SHIELD | Admitting: Family Medicine

## 2018-01-26 ENCOUNTER — Encounter: Payer: Self-pay | Admitting: Family Medicine

## 2018-01-26 VITALS — BP 120/80 | HR 64 | Temp 97.6°F | Resp 17 | Ht 73.0 in | Wt 203.8 lb

## 2018-01-26 DIAGNOSIS — Z0001 Encounter for general adult medical examination with abnormal findings: Secondary | ICD-10-CM

## 2018-01-26 DIAGNOSIS — E119 Type 2 diabetes mellitus without complications: Secondary | ICD-10-CM | POA: Diagnosis not present

## 2018-01-26 DIAGNOSIS — Z1211 Encounter for screening for malignant neoplasm of colon: Secondary | ICD-10-CM | POA: Diagnosis not present

## 2018-01-26 DIAGNOSIS — Z125 Encounter for screening for malignant neoplasm of prostate: Secondary | ICD-10-CM

## 2018-01-26 DIAGNOSIS — Z1329 Encounter for screening for other suspected endocrine disorder: Secondary | ICD-10-CM

## 2018-01-26 DIAGNOSIS — G8929 Other chronic pain: Secondary | ICD-10-CM

## 2018-01-26 DIAGNOSIS — E785 Hyperlipidemia, unspecified: Secondary | ICD-10-CM | POA: Diagnosis not present

## 2018-01-26 DIAGNOSIS — Z23 Encounter for immunization: Secondary | ICD-10-CM

## 2018-01-26 DIAGNOSIS — Z130 Encounter for screening for diseases of the blood and blood-forming organs and certain disorders involving the immune mechanism: Secondary | ICD-10-CM | POA: Diagnosis not present

## 2018-01-26 DIAGNOSIS — Z0000 Encounter for general adult medical examination without abnormal findings: Secondary | ICD-10-CM | POA: Insufficient documentation

## 2018-01-26 DIAGNOSIS — M546 Pain in thoracic spine: Secondary | ICD-10-CM

## 2018-01-26 LAB — PSA: PSA: 1.37 ng/mL (ref 0.10–4.00)

## 2018-01-26 LAB — COMPREHENSIVE METABOLIC PANEL
ALT: 21 U/L (ref 0–53)
AST: 23 U/L (ref 0–37)
Albumin: 4.3 g/dL (ref 3.5–5.2)
Alkaline Phosphatase: 53 U/L (ref 39–117)
BUN: 19 mg/dL (ref 6–23)
CO2: 30 mEq/L (ref 19–32)
Calcium: 9.3 mg/dL (ref 8.4–10.5)
Chloride: 103 mEq/L (ref 96–112)
Creatinine, Ser: 0.92 mg/dL (ref 0.40–1.50)
GFR: 88.95 mL/min (ref >60.00)
Glucose, Bld: 132 mg/dL — ABNORMAL HIGH (ref 70–99)
Potassium: 4.7 mEq/L (ref 3.5–5.1)
Sodium: 139 mEq/L (ref 135–145)
Total Bilirubin: 0.8 mg/dL (ref 0.2–1.2)
Total Protein: 6.8 g/dL (ref 6.0–8.3)

## 2018-01-26 LAB — CBC
HCT: 43.5 % (ref 39.0–52.0)
Hemoglobin: 14.7 g/dL (ref 13.0–17.0)
MCHC: 33.7 g/dL (ref 30.0–36.0)
MCV: 93 fl (ref 78.0–100.0)
Platelets: 211 10*3/uL (ref 150.0–400.0)
RBC: 4.68 Mil/uL (ref 4.22–5.81)
RDW: 12.8 % (ref 11.5–15.5)
WBC: 5.1 10*3/uL (ref 4.0–10.5)

## 2018-01-26 LAB — LIPID PANEL
Cholesterol: 129 mg/dL (ref 0–200)
HDL: 56.2 mg/dL (ref >39.00)
LDL Cholesterol: 62 mg/dL (ref 0–99)
NonHDL: 72.63
Total CHOL/HDL Ratio: 2
Triglycerides: 51 mg/dL (ref 0.0–149.0)
VLDL: 10.2 mg/dL (ref 0.0–40.0)

## 2018-01-26 LAB — HEMOGLOBIN A1C: Hgb A1c MFr Bld: 6.4 % (ref 4.6–6.5)

## 2018-01-26 LAB — TSH: TSH: 2.55 u[IU]/mL (ref 0.35–4.50)

## 2018-01-26 MED ORDER — EZETIMIBE 10 MG PO TABS: 10.0000 mg | ORAL_TABLET | Freq: Every day | ORAL | 3 refills | Status: DC

## 2018-01-26 MED ORDER — ROSUVASTATIN CALCIUM 10 MG PO TABS: 10.0000 mg | ORAL_TABLET | Freq: Every day | ORAL | 1 refills | Status: DC

## 2018-01-26 MED ORDER — METFORMIN HCL 500 MG PO TABS: 500.0000 mg | ORAL_TABLET | Freq: Every day | ORAL | 1 refills | Status: DC

## 2018-01-26 NOTE — Assessment & Plan Note (Signed)
Physical exam completed.  He will stay active.  Monitor diet.  Referral for colonoscopy.  PSA will be checked given his family history.  Tetanus vaccination and Pneumovax as well as flu vaccination given today.  He will return for Shingrix.  Lab work as outlined below.

## 2018-01-26 NOTE — Assessment & Plan Note (Signed)
Chronic issue.  Undetermined cause.  No palpable abnormalities.  Refer to orthopedics.

## 2018-01-26 NOTE — Patient Instructions (Signed)
Nice to see you. We will get lab work today and contact you with the results. We will get you set up for colonoscopy. Please contact your orthopedist when you are ready to see them.

## 2018-01-26 NOTE — Progress Notes (Signed)
Christopher Rumps, MD Phone: [phone]  Christopher Woods. is a 62 y.o. male who presents today for CPE.  Patient has an active job.  He has not been exercising since back surgery earlier this year. Diet is relatively healthy.  He stays away from sweets.  Does get some vegetables. Due for colonoscopy. Reports family history of prostate cancer in his father at age 27. Hepatitis C and HIV screening up-to-date.  Flu vaccine given today.  Due for tetanus vaccination and Pneumovax.  Also due for Shingrix. No tobacco use, alcohol use, or illicit drug use. Sees a dentist twice a year. Sees his ophthalmologist yearly. Has noted some muscular discomfort just adjacent to his left shoulder blade.  This has been going on since earlier this year.  Feels like a muscle strain.  Really only hurts him if he pushes on it or if he lays on it.  Not as bad with specific movements.  No specific injury.  Active Ambulatory Problems    Diagnosis Date Noted  . Diabetes mellitus type 2 in nonobese ([facility]) 02/26/2016  . Hypercholesterolemia 03/02/2016  . Confusion 03/19/2016  . Muscle strain 06/23/2016  . Left testicle enlargement 12/21/2016  . Varicose veins of both lower extremities 12/21/2016  . Elevated BP without diagnosis of hypertension 12/21/2016  . Left leg pain 04/12/2017  . Preop examination 05/13/2017  . 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 Acute back pain with sciatica 06/04/2017  . Enthesopathy of left hip region 07/27/2014  . Pain in joint, pelvic region and thigh 08/25/2011  . Radiculopathy of lumbar region 12/15/2011  . Tear of right acetabular labrum 09/02/2011  . Encounter for general adult medical examination with abnormal findings 01/26/2018  . Thoracic back pain 01/26/2018   Resolved Ambulatory Problems    Diagnosis Date Noted  . Type 2 diabetes mellitus without complication ([facility]) 00/93/8182  . IFG (impaired fasting glucose) 11/01/2015    Past Medical History:  Diagnosis Date  . Arthritis   . Dupuytren's disease   . History of kidney stones   . Lumbago   . MVP (mitral valve prolapse)   . Trigeminal neuralgia     Family History  Problem Relation Age of Onset  . Hypertension Mother   . Hypertension Father   . Cancer Father        prostate  . Hypertension Sister   . Cancer Sister        melanoma  . Kidney disease Sister     Social History   Socioeconomic History  . Marital status: Married    Spouse name: Not on file  . Number of children: Not on file  . Years of education: Not on file  . Highest education level: Not on file  Occupational History  . Not on file  Social Needs  . Financial resource strain: Not on file  . Food insecurity:    Worry: Not on file    Inability: Not on file  . Transportation needs:    Medical: Not on file    Non-medical: Not on file  Tobacco Use  . Smoking status: Never Smoker  . Smokeless tobacco: Never Used  Substance and Sexual Activity  . Alcohol use: No  . Drug use: No  . Sexual activity: Not on file  Lifestyle  . Physical activity:    Days per week: Not on file    Minutes per session: Not on file  . Stress: Not on file  Relationships  .  Social connections:    Talks on phone: Not on file    Gets together: Not on file    Attends religious service: Not on file    Active member of club or organization: Not on file    Attends meetings of clubs or organizations: Not on file    Relationship status: Not on file  . Intimate partner violence:    Fear of current or ex partner: Not on file    Emotionally abused: Not on file    Physically abused: Not on file    Forced sexual activity: Not on file  Other Topics Concern  . Not on file  Social History Narrative  . Not on file    ROS  General:  Negative for nexplained weight loss, fever Skin: Negative for new or changing mole, sore that won't heal HEENT: Negative for trouble hearing, trouble seeing, ringing in  ears, mouth sores, hoarseness, change in voice, dysphagia. CV:  Negative for chest pain, dyspnea, edema, palpitations Resp: Negative for cough, dyspnea, hemoptysis GI: Negative for nausea, vomiting, diarrhea, constipation, abdominal pain, melena, hematochezia. GU: Negative for dysuria, incontinence, urinary hesitance, hematuria, vaginal or penile discharge, polyuria, sexual difficulty, lumps in testicle or breasts MSK: Positive for muscle cramps or aches, Negative for joint pain or swelling Neuro: Negative for headaches, weakness, numbness, dizziness, passing out/fainting Psych: Negative for depression, anxiety, memory problems  Objective  Physical Exam Vitals:   01/26/18 0815  BP: 120/80  Pulse: 64  Resp: 17  Temp: 97.6 F (36.4 C)  SpO2: 96%    BP Readings from Last 3 Encounters:  01/26/18 120/80  11/15/17 113/60  06/11/17 121/77   Wt Readings from Last 3 Encounters:  01/26/18 203 lb 12 oz (92.4 kg)  06/08/17 200 lb (90.7 kg)  05/13/17 200 lb 9.6 oz (91 kg)    Physical Exam  Constitutional: No distress.  HENT:  Head: Normocephalic and atraumatic.  Mouth/Throat: Oropharynx is clear and moist.  Eyes: Pupils are equal, round, and reactive to light. Conjunctivae are normal.  Cardiovascular: Normal rate, regular rhythm and normal heart sounds.  Pulmonary/Chest: Effort normal and breath sounds normal.  Abdominal: Soft. Bowel sounds are normal. He exhibits no distension. There is no tenderness.  Musculoskeletal: He exhibits no edema.       Arms: Neurological: He is alert.  Skin: Skin is warm and dry. He is not diaphoretic.     Assessment/Plan:   Encounter for general adult medical examination with abnormal findings Physical exam completed.  He will stay active.  Monitor diet.  Referral for colonoscopy.  PSA will be checked given his family history.  Tetanus vaccination and Pneumovax as well as flu vaccination given today.  He will return for Shingrix.  Lab work as  outlined below.  Thoracic back pain Chronic issue.  Undetermined cause.  No palpable abnormalities.  Refer to orthopedics.   Orders Placed This Encounter  Procedures  . Flu Vaccine QUAD 36+ mos IM  . Pneumococcal polysaccharide vaccine 23-valent greater than or equal to 2yo subcutaneous/IM  . Tdap vaccine greater than or equal to 7yo IM  . HgB A1c  . Comp Met (CMET)  . Lipid panel  . CBC  . TSH  . PSA  . Ambulatory referral to Gastroenterology    Referral Priority:   Routine    Referral Type:   Consultation    Referral Reason:   Specialty Services Required    Number of Visits Requested:   1    Meds  ordered this encounter  Medications  . metFORMIN (GLUCOPHAGE) 500 MG tablet    Sig: Take 1 tablet (500 mg total) by mouth at bedtime.    Dispense:  90 tablet    Refill:  1  . rosuvastatin (CRESTOR) 10 MG tablet    Sig: Take 1 tablet (10 mg total) by mouth daily.    Dispense:  90 tablet    Refill:  1  . ezetimibe (ZETIA) 10 MG tablet    Sig: Take 1 tablet (10 mg total) by mouth daily.    Dispense:  90 tablet    Refill:  [place], MD [place]

## 2018-01-31 ENCOUNTER — Other Ambulatory Visit: Payer: Self-pay

## 2018-01-31 DIAGNOSIS — Z1211 Encounter for screening for malignant neoplasm of colon: Secondary | ICD-10-CM

## 2018-02-10 ENCOUNTER — Other Ambulatory Visit: Payer: Self-pay

## 2018-02-14 ENCOUNTER — Telehealth: Payer: Self-pay | Admitting: Gastroenterology

## 2018-02-14 NOTE — Telephone Encounter (Signed)
error 

## 2018-02-14 NOTE — Telephone Encounter (Signed)
Ea morning patient drinks a premier protein shake for breakfast 1 gm of fiber &30 gms of protrin, 24 vitamins an minerals. Should patient stop drinking these before proced? Pls call

## 2018-02-16 NOTE — Telephone Encounter (Signed)
Pt advised to hold off on drinking his protein drink tomorrow. He can resume after his procedure on Friday.

## 2018-02-17 NOTE — Discharge Instructions (Signed)
General Anesthesia, Adult, Care After °These instructions provide you with information about caring for yourself after your procedure. Your health care provider may also give you more specific instructions. Your treatment has been planned according to current medical practices, but problems sometimes occur. Call your health care provider if you have any problems or questions after your procedure. °What can I expect after the procedure? °After the procedure, it is common to have: °· Vomiting. °· A sore throat. °· Mental slowness. ° °It is common to feel: °· Nauseous. °· Cold or shivery. °· Sleepy. °· Tired. °· Sore or achy, even in parts of your body where you did not have surgery. ° °Follow these instructions at home: °For at least 24 hours after the procedure: °· Do not: °? Participate in activities where you could fall or become injured. °? Drive. °? Use heavy machinery. °? Drink alcohol. °? Take sleeping pills or medicines that cause drowsiness. °? Make important decisions or sign legal documents. °? Take care of children on your own. °· Rest. °Eating and drinking °· If you vomit, drink water, juice, or soup when you can drink without vomiting. °· Drink enough fluid to keep your urine clear or pale yellow. °· Make sure you have little or no nausea before eating solid foods. °· Follow the diet recommended by your health care provider. °General instructions °· Have a responsible adult stay with you until you are awake and alert. °· Return to your normal activities as told by your health care provider. Ask your health care provider what activities are safe for you. °· Take over-the-counter and prescription medicines only as told by your health care provider. °· If you smoke, do not smoke without supervision. °· Keep all follow-up visits as told by your health care provider. This is important. °Contact a health care provider if: °· You continue to have nausea or vomiting at home, and medicines are not helpful. °· You  cannot drink fluids or start eating again. °· You cannot urinate after 8-12 hours. °· You develop a skin rash. °· You have fever. °· You have increasing redness at the site of your procedure. °Get help right away if: °· You have difficulty breathing. °· You have chest pain. °· You have unexpected bleeding. °· You feel that you are having a life-threatening or urgent problem. °This information is not intended to replace advice given to you by your health care provider. Make sure you discuss any questions you have with your health care provider. °Document Released: 08/17/2000 Document Revised: 10/14/2015 Document Reviewed: 04/25/2015 °Elsevier Interactive Patient Education © 2018 [employer]vier Inc. ° °

## 2018-02-18 ENCOUNTER — Ambulatory Visit: Payer: BLUE CROSS/BLUE SHIELD | Admitting: Anesthesiology

## 2018-02-18 ENCOUNTER — Encounter
Admission: RE | Disposition: A | Discharge status: Home / Self Care | Payer: Self-pay | Source: Ambulatory Visit | Attending: Gastroenterology

## 2018-02-18 ENCOUNTER — Ambulatory Visit
Admission: RE | Admit: 2018-02-18 | Discharge: 2018-02-18 | Disposition: A | Discharge status: Home / Self Care | Payer: BLUE CROSS/BLUE SHIELD | Source: Ambulatory Visit | Attending: Gastroenterology | Admitting: Gastroenterology

## 2018-02-18 DIAGNOSIS — K635 Polyp of colon: Secondary | ICD-10-CM | POA: Insufficient documentation

## 2018-02-18 DIAGNOSIS — D124 Benign neoplasm of descending colon: Secondary | ICD-10-CM

## 2018-02-18 DIAGNOSIS — Z79899 Other long term (current) drug therapy: Secondary | ICD-10-CM | POA: Diagnosis not present

## 2018-02-18 DIAGNOSIS — M720 Palmar fascial fibromatosis [Dupuytren]: Secondary | ICD-10-CM | POA: Insufficient documentation

## 2018-02-18 DIAGNOSIS — E119 Type 2 diabetes mellitus without complications: Secondary | ICD-10-CM | POA: Diagnosis not present

## 2018-02-18 DIAGNOSIS — Z8042 Family history of malignant neoplasm of prostate: Secondary | ICD-10-CM | POA: Diagnosis not present

## 2018-02-18 DIAGNOSIS — Z87442 Personal history of urinary calculi: Secondary | ICD-10-CM | POA: Insufficient documentation

## 2018-02-18 DIAGNOSIS — Z8249 Family history of ischemic heart disease and other diseases of the circulatory system: Secondary | ICD-10-CM | POA: Diagnosis not present

## 2018-02-18 DIAGNOSIS — M1990 Unspecified osteoarthritis, unspecified site: Secondary | ICD-10-CM | POA: Insufficient documentation

## 2018-02-18 DIAGNOSIS — I341 Nonrheumatic mitral (valve) prolapse: Secondary | ICD-10-CM | POA: Insufficient documentation

## 2018-02-18 DIAGNOSIS — Z1211 Encounter for screening for malignant neoplasm of colon: Secondary | ICD-10-CM

## 2018-02-18 DIAGNOSIS — K648 Other hemorrhoids: Secondary | ICD-10-CM | POA: Diagnosis not present

## 2018-02-18 DIAGNOSIS — Z7984 Long term (current) use of oral hypoglycemic drugs: Secondary | ICD-10-CM | POA: Diagnosis not present

## 2018-02-18 HISTORY — PX: POLYPECTOMY: SHX5525

## 2018-02-18 HISTORY — PX: COLONOSCOPY WITH PROPOFOL: SHX5780

## 2018-02-18 LAB — GLUCOSE, CAPILLARY
Glucose-Capillary: 104 mg/dL — ABNORMAL HIGH (ref 70–99)
Glucose-Capillary: 116 mg/dL — ABNORMAL HIGH (ref 70–99)

## 2018-02-18 SURGERY — COLONOSCOPY WITH PROPOFOL
Anesthesia: General | Site: Rectum

## 2018-02-18 MED ORDER — SODIUM CHLORIDE 0.9 % IV SOLN: INTRAVENOUS | Status: DC

## 2018-02-18 MED ORDER — LIDOCAINE HCL (CARDIAC) PF 100 MG/5ML IV SOSY
PREFILLED_SYRINGE | INTRAVENOUS | Status: DC | PRN
  Administered 2018-02-18: 40 mg via INTRAVENOUS

## 2018-02-18 MED ORDER — OXYCODONE HCL 5 MG/5ML PO SOLN: 5.0000 mg | Freq: Once | ORAL | Status: DC | PRN

## 2018-02-18 MED ORDER — LACTATED RINGERS IV SOLN
INTRAVENOUS | Status: DC
  Administered 2018-02-18: 08:00:00 via INTRAVENOUS

## 2018-02-18 MED ORDER — PROPOFOL 10 MG/ML IV BOLUS
INTRAVENOUS | Status: DC | PRN
  Administered 2018-02-18 (×3): 20 mg via INTRAVENOUS
  Administered 2018-02-18: 80 mg via INTRAVENOUS
  Administered 2018-02-18 (×2): 20 mg via INTRAVENOUS

## 2018-02-18 MED ORDER — OXYCODONE HCL 5 MG PO TABS: 5.0000 mg | ORAL_TABLET | Freq: Once | ORAL | Status: DC | PRN

## 2018-02-18 MED ORDER — STERILE WATER FOR IRRIGATION IR SOLN
Status: DC | PRN
  Administered 2018-02-18: 10 mL

## 2018-02-18 SURGICAL SUPPLY — 6 items
CANISTER SUCT 1200ML W/VALVE (MISCELLANEOUS) ×2 IMPLANT
FORCEPS BIOP RAD 4 LRG CAP 4 (CUTTING FORCEPS) ×2 IMPLANT
GOWN CVR UNV OPN BCK APRN NK (MISCELLANEOUS) ×2 IMPLANT
GOWN ISOL THUMB LOOP REG UNIV (MISCELLANEOUS) ×2
KIT ENDO PROCEDURE OLY (KITS) ×2 IMPLANT
WATER STERILE IRR 250ML POUR (IV SOLUTION) ×2 IMPLANT

## 2018-02-18 NOTE — H&P (Signed)
Lucilla Lame, MD [facility] [address], [place], [place] [address] Phone: [phone] Fax : [phone]  Primary Care Physician:  Leone Haven, MD Primary Gastroenterologist:  Dr. Allen Norris  Pre-Procedure History & Physical: HPI:  Christopher Woods. is a 62 y.o. male is here for a screening colonoscopy.   Past Medical History:  Diagnosis Date  . Arthritis   . Dupuytren's disease   . History of kidney stones   . Lumbago   . MVP (mitral valve prolapse)   . Trigeminal neuralgia    resolved  . Type 2 diabetes mellitus without complication ([facility]) 0/35/4656    Past Surgical History:  Procedure Laterality Date  . HAND SURGERY Right    thumb reconstruction  . HIP SURGERY Left   . HIP SURGERY Right   . KNEE SURGERY Left   . KNEE SURGERY Right    meniscal tear  . LUMBAR LAMINECTOMY/DECOMPRESSION MICRODISCECTOMY Left 06/10/2017   Procedure: L4-5 disectomy left far lateral;  Surgeon: Melina Schools, MD;  Location: [facility];  Service: Orthopedics;  Laterality: Left;  120 mins  . TRIGGER FINGER RELEASE Left     Prior to Admission medications   Medication Sig Start Date End Date Taking? Authorizing Provider  Ascorbic Acid (VITAMIN C PO) Take by mouth.   Yes [provider]  ezetimibe (ZETIA) 10 MG tablet Take 1 tablet (10 mg total) by mouth daily. 01/26/18 04/26/18 Yes Leone Haven, MD  gabapentin (NEURONTIN) 300 MG capsule Take 300 mg by mouth 2 (two) times daily.   Yes [provider]  metFORMIN (GLUCOPHAGE) 500 MG tablet Take 1 tablet (500 mg total) by mouth at bedtime. 01/26/18  Yes Leone Haven, MD  Multiple Vitamin (MULTIVITAMIN) tablet Take 1 tablet by mouth daily.   Yes [provider]  rosuvastatin (CRESTOR) 10 MG tablet Take 1 tablet (10 mg total) by mouth daily. 01/26/18  Yes Leone Haven, MD  blood glucose meter kit and supplies KIT Dispense freestyle light. Use two times daily as directed. (FOR ICD-9 250.00, 250.01). 03/27/16    Leone Haven, MD  Omega-3 Fatty Acids (FISH OIL) 1200 MG CAPS Take 1,200 mg by mouth daily.    [provider]    Allergies as of 01/31/2018 - Review Complete 01/26/2018  Allergen Reaction Noted  . Indomethacin Rash 02/19/2015    Family History  Problem Relation Age of Onset  . Hypertension Mother   . Hypertension Father   . Cancer Father        prostate  . Hypertension Sister   . Cancer Sister        melanoma  . Kidney disease Sister     Social History   Socioeconomic History  . Marital status: Married    Spouse name: Not on file  . Number of children: Not on file  . Years of education: Not on file  . Highest education level: Not on file  Occupational History  . Not on file  Social Needs  . Financial resource strain: Not on file  . Food insecurity:    Worry: Not on file    Inability: Not on file  . Transportation needs:    Medical: Not on file    Non-medical: Not on file  Tobacco Use  . Smoking status: Never Smoker  . Smokeless tobacco: Never Used  Substance and Sexual Activity  . Alcohol use: No  . Drug use: No  . Sexual activity: Not on file  Lifestyle  .  Physical activity:    Days per week: Not on file    Minutes per session: Not on file  . Stress: Not on file  Relationships  . Social connections:    Talks on phone: Not on file    Gets together: Not on file    Attends religious service: Not on file    Active member of club or organization: Not on file    Attends meetings of clubs or organizations: Not on file    Relationship status: Not on file  . Intimate partner violence:    Fear of current or ex partner: Not on file    Emotionally abused: Not on file    Physically abused: Not on file    Forced sexual activity: Not on file  Other Topics Concern  . Not on file  Social History Narrative  . Not on file    Review of Systems: See HPI, otherwise negative ROS  Physical Exam: BP 139/75   Pulse (!) 58   Temp (!) 97.5 F (36.4 C)    Ht '6\' 1"'$  (1.854 m)   Wt 87.5 kg   SpO2 100%   BMI 25.46 kg/m  General:   Alert,  pleasant and cooperative in NAD Head:  Normocephalic and atraumatic. Neck:  Supple; no masses or thyromegaly. Lungs:  Clear throughout to auscultation.    Heart:  Regular rate and rhythm. Abdomen:  Soft, nontender and nondistended. Normal bowel sounds, without guarding, and without rebound.   Neurologic:  Alert and  oriented x4;  grossly normal neurologically.  Impression/Plan: Christopher Woods. is now here to undergo a screening colonoscopy.  Risks, benefits, and alternatives regarding colonoscopy have been reviewed with the patient.  Questions have been answered.  All parties agreeable.

## 2018-02-18 NOTE — Op Note (Signed)
[facility] Gastroenter[facility] Patient Name: Christopher Woods Procedure Date: 02/18/2018 7:13 AM MRN: [id number] Account #: [id number] Date of Birth: 14-Nov-1955 Admit Type: Outpatient Age: 62 Room: [facility] OR ROOM 01 Gender: Male Note Status: Finalized Procedure:            Colonoscopy Indications:          Screening for colorectal malignant neoplasm Providers:            Lucilla Lame MD, MD Referring MD:         Angela Adam. Caryl Bis (Referring MD) Medicines:            Propofol per Anesthesia Complications:        No immediate complications. Procedure:            Pre-Anesthesia Assessment:                       - Prior to the procedure, a History and Physical was                        performed, and patient medications and allergies were                        reviewed. The patient's tolerance of previous                        anesthesia was also reviewed. The risks and benefits of                        the procedure and the sedation options and risks were                        discussed with the patient. All questions were                        answered, and informed consent was obtained. Prior                        Anticoagulants: The patient has taken no previous                        anticoagulant or antiplatelet agents. ASA Grade                        Assessment: II - A patient with mild systemic disease.                        After reviewing the risks and benefits, the patient was                        deemed in satisfactory condition to undergo the                        procedure.                       After obtaining informed consent, the colonoscope was                        passed under direct vision. Throughout the procedure,  the patient's blood pressure, pulse, and oxygen                        saturations were monitored continuously. The was                        introduced through the anus and advanced to the the                  cecum, identified by appendiceal orifice and ileocecal                        valve. The colonoscopy was performed without                        difficulty. The patient tolerated the procedure well.                        The quality of the bowel preparation was excellent. Findings:      The perianal and digital rectal examinations were normal.      A 3 mm polyp was found in the descending colon. The polyp was sessile.       The polyp was removed with a cold biopsy forceps. Resection and       retrieval were complete.      Non-bleeding internal hemorrhoids were found during retroflexion. The       hemorrhoids were Grade I (internal hemorrhoids that do not prolapse). Impression:           - One 3 mm polyp in the descending colon, removed with                        a cold biopsy forceps. Resected and retrieved.                       - Non-bleeding internal hemorrhoids. Recommendation:       - Discharge patient to home.                       - Resume previous diet.                       - Continue present medications.                       - Await pathology results.                       - Repeat colonoscopy in 5 years if polyp adenoma and 10                        years if hyperplastic Procedure Code(s):    --- Professional ---                       [phone], Colonoscopy, flexible; with biopsy, single or                        multiple Diagnosis Code(s):    --- Professional ---                       Z12.11, Encounter for screening for malignant neoplasm  of colon                       D12.4, Benign neoplasm of descending colon CPT copyright 2017 American Medical Association. All rights reserved. The codes documented in this report are preliminary and upon coder review may  be revised to meet current compliance requirements. Lucilla Lame MD, MD 02/18/2018 8:16:23 AM This report has been signed electronically. Number of Addenda: 0 Note Initiated On:  02/18/2018 7:13 AM Scope Withdrawal Time: 0 hours 6 minutes 38 seconds  Total Procedure Duration: 0 hours 11 minutes 2 seconds       [facility]

## 2018-02-18 NOTE — Anesthesia Procedure Notes (Signed)
Performed by: Domonique Brouillard, CRNA Pre-anesthesia Checklist: Patient identified, Emergency Drugs available, Suction available, Timeout performed and Patient being monitored Patient Re-evaluated:Patient Re-evaluated prior to induction Oxygen Delivery Method: Nasal cannula Placement Confirmation: positive ETCO2       

## 2018-02-18 NOTE — Anesthesia Preprocedure Evaluation (Signed)
Anesthesia Evaluation  Patient identified by MRN, date of birth, ID band  Reviewed: NPO status   History of Anesthesia Complications Negative for: history of anesthetic complications  Airway Mallampati: II  TM Distance: >3 FB Neck ROM: full    Dental no notable dental hx.    Pulmonary neg pulmonary ROS,    Pulmonary exam normal        Cardiovascular Exercise Tolerance: Good negative cardio ROS Normal cardiovascular exam     Neuro/Psych Toe neuropathy negative psych ROS   GI/Hepatic negative GI ROS, Neg liver ROS,   Endo/Other  diabetes  Renal/GU negative Renal ROS  negative genitourinary   Musculoskeletal  (+) Arthritis ,   Abdominal   Peds  Hematology negative hematology ROS (+)   Anesthesia Other Findings tiva  Reproductive/Obstetrics                             Anesthesia Physical Anesthesia Plan  ASA: II  Anesthesia Plan: General   Post-op Pain Management:    Induction:   PONV Risk Score and Plan:   Airway Management Planned: Natural Airway  Additional Equipment:   Intra-op Plan:   Post-operative Plan:   Informed Consent: I have reviewed the patients History and Physical, chart, labs and discussed the procedure including the risks, benefits and alternatives for the proposed anesthesia with the patient or authorized representative who has indicated his/her understanding and acceptance.     Plan Discussed with: CRNA  Anesthesia Plan Comments:         Anesthesia Quick Evaluation

## 2018-02-18 NOTE — Anesthesia Postprocedure Evaluation (Signed)
Anesthesia Post Note  Patient: Shafer Swamy.  Procedure(s) Performed: COLONOSCOPY WITH PROPOFOL WITH BIOPSY (N/A Rectum) POLYPECTOMY (N/A Rectum)  Patient location during evaluation: PACU Anesthesia Type: General Level of consciousness: awake and alert Pain management: pain level controlled Vital Signs Assessment: post-procedure vital signs reviewed and stable Respiratory status: spontaneous breathing, nonlabored ventilation, respiratory function stable and patient connected to nasal cannula oxygen Cardiovascular status: blood pressure returned to baseline and stable Postop Assessment: no apparent nausea or vomiting Anesthetic complications: no    Albaro Deviney

## 2018-02-18 NOTE — Transfer of Care (Signed)
Immediate Anesthesia Transfer of Care Note  Patient: Christopher Woods.  Procedure(s) Performed: COLONOSCOPY WITH PROPOFOL WITH BIOPSY (N/A Rectum) POLYPECTOMY (N/A Rectum)  Patient Location: PACU  Anesthesia Type: General  Level of Consciousness: awake, alert  and patient cooperative  Airway and Oxygen Therapy: Patient Spontanous Breathing and Patient connected to supplemental oxygen  Post-op Assessment: Post-op Vital signs reviewed, Patient's Cardiovascular Status Stable, Respiratory Function Stable, Patent Airway and No signs of Nausea or vomiting  Post-op Vital Signs: Reviewed and stable  Complications: No apparent anesthesia complications

## 2018-02-21 ENCOUNTER — Encounter: Payer: Self-pay | Admitting: Gastroenterology

## 2018-02-22 ENCOUNTER — Encounter: Payer: Self-pay | Admitting: Gastroenterology

## 2018-04-05 ENCOUNTER — Ambulatory Visit: Payer: BLUE CROSS/BLUE SHIELD | Admitting: Podiatry

## 2018-04-05 ENCOUNTER — Encounter: Payer: Self-pay | Admitting: Podiatry

## 2018-04-05 DIAGNOSIS — Q828 Other specified congenital malformations of skin: Secondary | ICD-10-CM

## 2018-04-05 DIAGNOSIS — M545 Low back pain: Secondary | ICD-10-CM | POA: Diagnosis not present

## 2018-04-05 NOTE — Progress Notes (Signed)
Presents today chief complaint of painful porokeratotic lesions plantar aspect of the bilateral foot states that they come back and I can get them ourselves.  States that they are very painful to walk on.  Objective: Vital signs are stable alert and oriented x3.  Porokeratotic lesions plantar aspect of the forefoot 5 on the left foot 2 on the right foot was debrided no iatrogenic lesions.  Assessment: Porokeratosis bilateral foot.  Plan: Debridement of lesions 5 on the left heel on the right follow-up with him on an as-needed basis.

## 2018-06-06 ENCOUNTER — Other Ambulatory Visit: Payer: Self-pay | Admitting: Cardiovascular Disease

## 2018-06-13 ENCOUNTER — Other Ambulatory Visit: Payer: Self-pay | Admitting: *Deleted

## 2018-06-22 ENCOUNTER — Encounter: Payer: Self-pay | Admitting: Nurse Practitioner

## 2018-06-22 ENCOUNTER — Ambulatory Visit (INDEPENDENT_AMBULATORY_CARE_PROVIDER_SITE_OTHER): Payer: BLUE CROSS/BLUE SHIELD | Admitting: Nurse Practitioner

## 2018-06-22 VITALS — BP 130/70 | HR 68 | Ht 73.0 in | Wt 211.7 lb

## 2018-06-22 DIAGNOSIS — E782 Mixed hyperlipidemia: Secondary | ICD-10-CM

## 2018-06-22 DIAGNOSIS — R931 Abnormal findings on diagnostic imaging of heart and coronary circulation: Secondary | ICD-10-CM | POA: Diagnosis not present

## 2018-06-22 MED ORDER — ROSUVASTATIN CALCIUM 10 MG PO TABS: 10.0000 mg | ORAL_TABLET | Freq: Every day | ORAL | 3 refills | Status: DC

## 2018-06-22 MED ORDER — EZETIMIBE 10 MG PO TABS: 10.0000 mg | ORAL_TABLET | Freq: Every day | ORAL | 3 refills | Status: DC

## 2018-06-22 NOTE — Patient Instructions (Signed)
Medication Instructions:  Your physician recommends that you continue on your current medications as directed. Please refer to the Current Medication list given to you today.  If you need a refill on your cardiac medications before your next appointment, please call your pharmacy.   Lab work: none If you have labs (blood work) drawn today and your tests are completely normal, you will receive your results only by: Marland Kitchen MyChart Message (if you have MyChart) OR . A paper copy in the mail If you have any lab test that is abnormal or we need to change your treatment, we will call you to review the results.  Testing/Procedures: none  Follow-Up: At [facility], you and your health needs are our priority.  As part of our continuing mission to provide you with exceptional heart care, we have created designated Provider Care Teams.  These Care Teams include your primary Cardiologist (physician) and Advanced Practice Providers (APPs -  Physician Assistants and Nurse Practitioners) who all work together to provide you with the care you need, when you need it. You will need a follow up appointment in 12 months.  Please call our office 2 months in advance to schedule this appointment.  You may see Ida Rogue, MD or one of the following Advanced Practice Providers on your designated Care Team:   Murray Hodgkins, NP Christell Faith, PA-C . Marrianne Mood, PA-C

## 2018-06-22 NOTE — Progress Notes (Signed)
Office Visit    Patient Name: Christopher Woods. Date of Encounter: 06/22/2018  Primary Care Provider:  Leone Haven, MD Primary Cardiologist:  Ida Rogue, MD  Chief Complaint    63 year old male with a history of diabetes, varicose veins, mitral valve prolapse, and coronary calcium noted on CT, who presents for follow-up for risk factor modification.  Past Medical History    Past Medical History:  Diagnosis Date  . Arthritis   . Dupuytren's disease   . High coronary artery calcium score    a. 04/2017 Cardiac CT: Cor Ca2+ = 100 (68th%'ile).  . History of kidney stones   . Lumbago   . MVP (mitral valve prolapse)   . Trigeminal neuralgia    resolved  . Type 2 diabetes mellitus without complication ([place]) 7/51/0258   Past Surgical History:  Procedure Laterality Date  . COLONOSCOPY WITH PROPOFOL N/A 02/18/2018   Procedure: COLONOSCOPY WITH PROPOFOL WITH BIOPSY;  Surgeon: Lucilla Lame, MD;  Location: [place];  Service: Endoscopy;  Laterality: N/A;  Diabetic- requests to be first  . HAND SURGERY Right    thumb reconstruction  . HIP SURGERY Left   . HIP SURGERY Right   . KNEE SURGERY Left   . KNEE SURGERY Right    meniscal tear  . LUMBAR LAMINECTOMY/DECOMPRESSION MICRODISCECTOMY Left 06/10/2017   Procedure: L4-5 disectomy left far lateral;  Surgeon: Melina Schools, MD;  Location: [place];  Service: Orthopedics;  Laterality: Left;  120 mins  . POLYPECTOMY N/A 02/18/2018   Procedure: POLYPECTOMY;  Surgeon: Lucilla Lame, MD;  Location: [place];  Service: Endoscopy;  Laterality: N/A;  . TRIGGER FINGER RELEASE Left     Allergies  Allergies  Allergen Reactions  . Indomethacin Rash    History of Present Illness    63 year old male with a history of diabetes, varicose veins, mitral valve prolapse, osteoarthritis, degenerative disc disease status post back surgery in early 2019, and elevated calcium score on cardiac CT.  He was last seen in  clinic in late 2018, at which time he was doing well from a cardiac standpoint.  Because of risk factors, he underwent cardiac CT for calcium scoring which showed a calcium score of 100, placing him in the 60th percentile.  Aggressive risk factor modification and statin therapy was advised and initiated.  He has since been on Crestor 10 mg and Zetia 10 mg.  With that combination, his LDL was 62 in September 2019.  He has some degree of chronic upper extremity muscle aches and says he thinks this is mostly attributable to the type of work he does, working on large trucks and Financial controller for [employer].  He does not think that his symptoms have worsened following initiation of statin therapy.  Though he is relatively active at work, he does not routinely exercise outside of work.  He had back surgery in early 2019 and says he has been taking it easy since then.  He denies any history of chest pain, dyspnea, PND, orthopnea, dizziness, syncope, edema, or early satiety.  He has remote history of palpitations but has not had any in the past several years.  Home Medications    Prior to Admission medications   Medication Sig Start Date End Date Taking? Authorizing Provider  Ascorbic Acid (VITAMIN C PO) Take by mouth.    [provider]  blood glucose meter kit and supplies KIT Dispense freestyle light. Use two times daily as directed. (FOR ICD-9 250.00, 250.01).  03/27/16   Leone Haven, MD  ezetimibe (ZETIA) 10 MG tablet Take 1 tablet (10 mg total) by mouth daily. 01/26/18 04/26/18  Leone Haven, MD  gabapentin (NEURONTIN) 300 MG capsule Take 300 mg by mouth 2 (two) times daily.    [provider]  metFORMIN (GLUCOPHAGE) 500 MG tablet Take 1 tablet (500 mg total) by mouth at bedtime. 01/26/18   Leone Haven, MD  Multiple Vitamin (MULTIVITAMIN) tablet Take 1 tablet by mouth daily.    [provider]  Omega-3 Fatty Acids (FISH OIL) 1200 MG CAPS Take 1,200 mg by mouth daily.     [provider]  rosuvastatin (CRESTOR) 10 MG tablet Take 1 tablet (10 mg total) by mouth daily. 01/26/18   Leone Haven, MD    Review of Systems    He denies chest pain, palpitations, dyspnea, pnd, orthopnea, n, v, dizziness, syncope, edema, weight gain, or early satiety.  All other systems reviewed and are otherwise negative except as noted above.  Physical Exam    VS:  BP 130/70 (BP Location: Left Arm, Patient Position: Sitting, Cuff Size: Normal)   Pulse 68   Ht '6\' 1"'$  (1.854 m)   Wt 211 lb 11 oz (96 kg)   BMI 27.93 kg/m  , BMI Body mass index is 27.93 kg/m. GEN: Well nourished, well developed, in no acute distress. HEENT: normal. Neck: Supple, no JVD, carotid bruits, or masses. Cardiac: RRR, no murmurs, rubs, or gallops. No clubbing, cyanosis, edema.  Radials/DP/PT 2+ and equal bilaterally.  Respiratory:  Respirations regular and unlabored, clear to auscultation bilaterally. GI: Soft, nontender, nondistended, BS + x 4. MS: no deformity or atrophy. Skin: warm and dry, no rash. Neuro:  Strength and sensation are intact. Psych: Normal affect.  Accessory Clinical Findings    ECG personally reviewed by me today -regular sinus rhythm, 68- no acute changes.  Assessment & Plan    1.  Coronary calcium noted on CT: Coronary calcium score of 100, placing him in the 60th percentile in late 2018.  He is relatively active at work, walking frequently and completely asymptomatic with any history of chest pain or dyspnea.  He is on statin and Zetia therapy, which he tolerates.  LDL was 62 in September 2019.  Blood pressure borderline today at 130/70.  Reviewed blood pressure trend over the past year shows multiple recordings in the 120s.  I have encouraged him to follow his blood pressure and also increase activity outside of work.  No further ischemic evaluation warranted at this time.  2.  Hyperlipidemia: Tolerating statin and Zetia therapy.  LDL 62 in September with normal LFTs  at that time.  Refilling Crestor and Zetia today.  3.  Type 2 diabetes mellitus: A1c was 6.4 in September.  He is on metformin therapy.  4.  Disposition: Follow-up in 1 year or sooner if necessary.   Murray Hodgkins, NP 06/22/2018, 7:52 AM

## 2018-07-27 ENCOUNTER — Ambulatory Visit: Payer: BLUE CROSS/BLUE SHIELD | Admitting: Family Medicine

## 2018-09-06 ENCOUNTER — Telehealth: Payer: Self-pay

## 2018-09-06 NOTE — Telephone Encounter (Signed)
Copied from [place] [id number]. Topic: Quick Communication - Appointment Cancellation >> Sep 06, 2018 11:37 AM Margot Ables wrote: Patient called to cancel appointment scheduled for 09/09/2018. Patient has not rescheduled their appointment. Pt wants to cancel and does not wish to reschedule. He will keep appt in 01/2019.  Route to department's PEC pool.

## 2018-09-08 NOTE — Telephone Encounter (Signed)
Ok. Thank you.

## 2018-09-09 ENCOUNTER — Ambulatory Visit: Payer: BLUE CROSS/BLUE SHIELD | Admitting: Family Medicine

## 2018-09-27 ENCOUNTER — Other Ambulatory Visit: Payer: Self-pay | Admitting: Family Medicine

## 2018-09-27 NOTE — Telephone Encounter (Signed)
medicaiton sent to pharmacy, called pt and he is scheduled for a visit in September.  Nina,cma

## 2018-09-27 NOTE — Telephone Encounter (Signed)
Spouse checking on the status of medication refill request, stating the pharmacy sent in request last twice, please advise  [id number] [address], [place] [phone] (Phone) [phone] (Fax)

## 2018-10-31 ENCOUNTER — Ambulatory Visit: Payer: BLUE CROSS/BLUE SHIELD | Admitting: Podiatry

## 2019-01-31 ENCOUNTER — Other Ambulatory Visit: Payer: Self-pay

## 2019-02-01 ENCOUNTER — Other Ambulatory Visit: Payer: Self-pay

## 2019-02-01 ENCOUNTER — Encounter: Payer: Self-pay | Admitting: Family Medicine

## 2019-02-01 ENCOUNTER — Ambulatory Visit (INDEPENDENT_AMBULATORY_CARE_PROVIDER_SITE_OTHER): Payer: BC Managed Care – PPO | Admitting: Family Medicine

## 2019-02-01 VITALS — BP 130/70 | HR 64 | Temp 98.5°F | Ht 73.0 in | Wt 211.8 lb

## 2019-02-01 DIAGNOSIS — Z130 Encounter for screening for diseases of the blood and blood-forming organs and certain disorders involving the immune mechanism: Secondary | ICD-10-CM | POA: Diagnosis not present

## 2019-02-01 DIAGNOSIS — M25512 Pain in left shoulder: Secondary | ICD-10-CM | POA: Diagnosis not present

## 2019-02-01 DIAGNOSIS — M25511 Pain in right shoulder: Secondary | ICD-10-CM | POA: Diagnosis not present

## 2019-02-01 DIAGNOSIS — Z125 Encounter for screening for malignant neoplasm of prostate: Secondary | ICD-10-CM

## 2019-02-01 DIAGNOSIS — E7800 Pure hypercholesterolemia, unspecified: Secondary | ICD-10-CM

## 2019-02-01 DIAGNOSIS — M7542 Impingement syndrome of left shoulder: Secondary | ICD-10-CM | POA: Diagnosis not present

## 2019-02-01 DIAGNOSIS — M791 Myalgia, unspecified site: Secondary | ICD-10-CM

## 2019-02-01 DIAGNOSIS — Z0000 Encounter for general adult medical examination without abnormal findings: Secondary | ICD-10-CM

## 2019-02-01 DIAGNOSIS — E119 Type 2 diabetes mellitus without complications: Secondary | ICD-10-CM

## 2019-02-01 DIAGNOSIS — Z1329 Encounter for screening for other suspected endocrine disorder: Secondary | ICD-10-CM

## 2019-02-01 DIAGNOSIS — M7541 Impingement syndrome of right shoulder: Secondary | ICD-10-CM | POA: Diagnosis not present

## 2019-02-01 DIAGNOSIS — Z23 Encounter for immunization: Secondary | ICD-10-CM | POA: Diagnosis not present

## 2019-02-01 DIAGNOSIS — Z0001 Encounter for general adult medical examination with abnormal findings: Secondary | ICD-10-CM

## 2019-02-01 LAB — COMPREHENSIVE METABOLIC PANEL
ALT: 21 U/L (ref 0–53)
AST: 23 U/L (ref 0–37)
Albumin: 4.1 g/dL (ref 3.5–5.2)
Alkaline Phosphatase: 55 U/L (ref 39–117)
BUN: 13 mg/dL (ref 6–23)
CO2: 30 mEq/L (ref 19–32)
Calcium: 9.1 mg/dL (ref 8.4–10.5)
Chloride: 104 mEq/L (ref 96–112)
Creatinine, Ser: 0.87 mg/dL (ref 0.40–1.50)
GFR: 88.96 mL/min (ref >60.00)
Glucose, Bld: 122 mg/dL — ABNORMAL HIGH (ref 70–99)
Potassium: 4.3 mEq/L (ref 3.5–5.1)
Sodium: 140 mEq/L (ref 135–145)
Total Bilirubin: 0.5 mg/dL (ref 0.2–1.2)
Total Protein: 6.6 g/dL (ref 6.0–8.3)

## 2019-02-01 LAB — CBC
HCT: 42.7 % (ref 39.0–52.0)
Hemoglobin: 14.2 g/dL (ref 13.0–17.0)
MCHC: 33.3 g/dL (ref 30.0–36.0)
MCV: 94.8 fl (ref 78.0–100.0)
Platelets: 203 10*3/uL (ref 150.0–400.0)
RBC: 4.5 Mil/uL (ref 4.22–5.81)
RDW: 12.8 % (ref 11.5–15.5)
WBC: 4.9 10*3/uL (ref 4.0–10.5)

## 2019-02-01 LAB — LIPID PANEL
Cholesterol: 113 mg/dL (ref 0–200)
HDL: 52.3 mg/dL (ref >39.00)
LDL Cholesterol: 55 mg/dL (ref 0–99)
NonHDL: 60.92
Total CHOL/HDL Ratio: 2
Triglycerides: 32 mg/dL (ref 0.0–149.0)
VLDL: 6.4 mg/dL (ref 0.0–40.0)

## 2019-02-01 LAB — MICROALBUMIN / CREATININE URINE RATIO
Creatinine,U: 156.8 mg/dL
Microalb Creat Ratio: 0.4 mg/g (ref 0.0–30.0)
Microalb, Ur: <0.7 mg/dL (ref 0.0–1.9)

## 2019-02-01 LAB — CK: Total CK: 139 U/L (ref 7–232)

## 2019-02-01 LAB — PSA: PSA: 1.32 ng/mL (ref 0.10–4.00)

## 2019-02-01 LAB — TSH: TSH: 1.53 u[IU]/mL (ref 0.35–4.50)

## 2019-02-01 LAB — HEMOGLOBIN A1C: Hgb A1c MFr Bld: 6.4 % (ref 4.6–6.5)

## 2019-02-01 NOTE — Assessment & Plan Note (Signed)
Physical exam completed.  Encouraged continued activity and healthy diet.  Prostate cancer screening due today with PSA.  Flu vaccine given.  He will see his eye doctor this year.  Lab work as outlined below.  He will keep his appointment with orthopedics for his shoulder.  Check CK to ensure no muscle breakdown from Crestor.

## 2019-02-01 NOTE — Patient Instructions (Signed)
Nice to see you. We will get labs today and contact you with the results.  Please continue with diet and exercise.

## 2019-02-01 NOTE — Progress Notes (Signed)
Christopher Rumps, MD Phone: [phone]  [name] Garfield Coiner. is a 63 y.o. male who presents today for CPE.  Diet: Patient has been eating healthier.  Eating more salads.  He has a protein shake in the morning.  Sandwich for lunch.  Leaner meats. Exercise: Limited by left foot drop following back surgery.  He does walk some and is trying to get as much exercise as he is able to. No family history of colon cancer.  He does note a family history of prostate cancer in his father.  Colonoscopy is up-to-date 02/18/2018 with 10-year recall.  Prostate cancer screening is due. Tetanus vaccine up-to-date.  Flu vaccine given.  Pneumonia vaccine up-to-date.  Shingrix deferred as the patient's wife has a weakened immune system right now and we do not want to confuse any potential ill feelings following Shingrix with any other illnesses such as the flu or COVID-19. Denies tobacco use, alcohol use, and illicit drug use. Sees a dentist.  Due to see an ophthalmologist. He does report some mild right shoulder discomfort and right shoulder popping with some muscle aching in his bilateral arms.  He is seeing orthopedics later today for this.  This has been going on about 6 months.  Active Ambulatory Problems    Diagnosis Date Noted  . Diabetes mellitus type 2 in nonobese ([place]) 02/26/2016  . Hypercholesterolemia 03/02/2016  . Confusion 03/19/2016  . Muscle strain 06/23/2016  . Left testicle enlargement 12/21/2016  . Varicose veins of both lower extremities 12/21/2016  . Elevated BP without diagnosis of hypertension 12/21/2016  . Left leg pain 04/12/2017  . Preop examination 05/13/2017  . 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 Acute back pain with sciatica 06/04/2017  . Enthesopathy of left hip region 07/27/2014  . Pain in joint, pelvic region and thigh 08/25/2011  . Radiculopathy of lumbar region 12/15/2011  . Tear of right acetabular labrum 09/02/2011  .  Encounter for general adult medical examination with abnormal findings 01/26/2018  . Thoracic back pain 01/26/2018  . Encounter for screening colonoscopy   . Benign neoplasm of descending colon    Resolved Ambulatory Problems    Diagnosis Date Noted  . Type 2 diabetes mellitus without complication ([place]) 62/26/3335  . IFG (impaired fasting glucose) 11/01/2015   Past Medical History:  Diagnosis Date  . Arthritis   . Dupuytren's disease   . High coronary artery calcium score   . History of kidney stones   . Lumbago   . MVP (mitral valve prolapse)   . Trigeminal neuralgia     Family History  Problem Relation Age of Onset  . Hypertension Mother   . Hypertension Father   . Cancer Father        prostate  . Hypertension Sister   . Cancer Sister        melanoma  . Kidney disease Sister     Social History   Socioeconomic History  . Marital status: Married    Spouse name: Not on file  . Number of children: Not on file  . Years of education: Not on file  . Highest education level: Not on file  Occupational History  . Not on file  Social Needs  . Financial resource strain: Not on file  . Food insecurity    Worry: Not on file    Inability: Not on file  . Transportation needs    Medical: Not on file    Non-medical: Not on file  Tobacco Use  . Smoking status: Never Smoker  . Smokeless tobacco: Never Used  Substance and Sexual Activity  . Alcohol use: No  . Drug use: No  . Sexual activity: Not on file  Lifestyle  . Physical activity    Days per week: Not on file    Minutes per session: Not on file  . Stress: Not on file  Relationships  . Social Herbalist on phone: Not on file    Gets together: Not on file    Attends religious service: Not on file    Active member of club or organization: Not on file    Attends meetings of clubs or organizations: Not on file    Relationship status: Not on file  . Intimate partner violence    Fear of current or ex  partner: Not on file    Emotionally abused: Not on file    Physically abused: Not on file    Forced sexual activity: Not on file  Other Topics Concern  . Not on file  Social History Narrative  . Not on file    ROS  General:  Negative for nexplained weight loss, fever Skin: Negative for new or changing mole, sore that won't heal HEENT: Negative for trouble hearing, trouble seeing, ringing in ears, mouth sores, hoarseness, change in voice, dysphagia. CV:  Negative for chest pain, dyspnea, edema, palpitations Resp: Negative for cough, dyspnea, hemoptysis GI: Negative for nausea, vomiting, diarrhea, constipation, abdominal pain, melena, hematochezia. GU: Negative for dysuria, incontinence, urinary hesitance, hematuria, vaginal or penile discharge, polyuria, sexual difficulty, lumps in testicle or breasts MSK: Positive for muscle cramps or aches, negative for joint pain or swelling Neuro: Negative for headaches, weakness, numbness, dizziness, passing out/fainting Psych: Negative for depression, anxiety, memory problems  Objective  Physical Exam Vitals:   02/01/19 0813  BP: 130/70  Pulse: 64  Temp: 98.5 F (36.9 C)  SpO2: 96%    BP Readings from Last 3 Encounters:  02/01/19 130/70  06/22/18 130/70  02/18/18 133/78   Wt Readings from Last 3 Encounters:  02/01/19 211 lb 12.8 oz (96.1 kg)  06/22/18 211 lb 11 oz (96 kg)  02/18/18 193 lb (87.5 kg)    Physical Exam Constitutional:      General: He is not in acute distress.    Appearance: He is not diaphoretic.  HENT:     Head: Normocephalic and atraumatic.  Eyes:     Conjunctiva/sclera: Conjunctivae normal.     Pupils: Pupils are equal, round, and reactive to light.  Cardiovascular:     Rate and Rhythm: Normal rate and regular rhythm.     Heart sounds: Normal heart sounds.  Pulmonary:     Effort: Pulmonary effort is normal.     Breath sounds: Normal breath sounds.  Abdominal:     General: Bowel sounds are normal.  There is no distension.     Palpations: Abdomen is soft.     Tenderness: There is no abdominal tenderness. There is no guarding.  Musculoskeletal:     Right lower leg: No edema.     Left lower leg: No edema.     Comments: No right shoulder tenderness, he does have discomfort on passive abduction and internal rotation of his right shoulder, some discomfort on active abduction, positive empty can on the right  Lymphadenopathy:     Cervical: No cervical adenopathy.  Skin:    General: Skin is warm and dry.  Neurological:  Mental Status: He is alert.  Psychiatric:        Mood and Affect: Mood normal.      Assessment/Plan:   Encounter for general adult medical examination with abnormal findings Physical exam completed.  Encouraged continued activity and healthy diet.  Prostate cancer screening due today with PSA.  Flu vaccine given.  He will see his eye doctor this year.  Lab work as outlined below.  He will keep his appointment with orthopedics for his shoulder.  Check CK to ensure no muscle breakdown from Crestor.   Orders Placed This Encounter  Procedures  . Flu Vaccine QUAD 36+ mos IM  . TSH  . CBC  . Comp Met (CMET)  . HgB A1c  . Lipid panel  . PSA  . Urine Microalbumin w/creat. ratio  . CK (Creatine Kinase)    No orders of the defined types were placed in this encounter.    Christopher Rumps, MD [place]

## 2019-02-24 DIAGNOSIS — M7542 Impingement syndrome of left shoulder: Secondary | ICD-10-CM | POA: Insufficient documentation

## 2019-03-01 DIAGNOSIS — M25511 Pain in right shoulder: Secondary | ICD-10-CM | POA: Diagnosis not present

## 2019-03-01 DIAGNOSIS — M7541 Impingement syndrome of right shoulder: Secondary | ICD-10-CM | POA: Diagnosis not present

## 2019-03-01 DIAGNOSIS — M7542 Impingement syndrome of left shoulder: Secondary | ICD-10-CM | POA: Diagnosis not present

## 2019-03-01 DIAGNOSIS — M25512 Pain in left shoulder: Secondary | ICD-10-CM | POA: Diagnosis not present

## 2019-03-03 DIAGNOSIS — M542 Cervicalgia: Secondary | ICD-10-CM | POA: Diagnosis not present

## 2019-03-03 DIAGNOSIS — E119 Type 2 diabetes mellitus without complications: Secondary | ICD-10-CM | POA: Insufficient documentation

## 2019-03-14 ENCOUNTER — Other Ambulatory Visit: Payer: Self-pay | Admitting: Family Medicine

## 2019-03-29 DIAGNOSIS — M7542 Impingement syndrome of left shoulder: Secondary | ICD-10-CM | POA: Diagnosis not present

## 2019-03-29 DIAGNOSIS — M7541 Impingement syndrome of right shoulder: Secondary | ICD-10-CM | POA: Diagnosis not present

## 2019-06-29 ENCOUNTER — Other Ambulatory Visit: Payer: Self-pay

## 2019-06-29 MED ORDER — EZETIMIBE 10 MG PO TABS: 10.0000 mg | ORAL_TABLET | Freq: Every day | ORAL | 0 refills | Status: DC

## 2019-07-07 ENCOUNTER — Ambulatory Visit
Admission: EM | Admit: 2019-07-07 | Discharge: 2019-07-07 | Disposition: A | Discharge status: Home / Self Care | Payer: BC Managed Care – PPO | Attending: Emergency Medicine | Admitting: Emergency Medicine

## 2019-07-07 ENCOUNTER — Other Ambulatory Visit: Payer: Self-pay

## 2019-07-07 ENCOUNTER — Encounter: Payer: Self-pay | Admitting: Emergency Medicine

## 2019-07-07 ENCOUNTER — Telehealth: Payer: Self-pay

## 2019-07-07 DIAGNOSIS — B349 Viral infection, unspecified: Secondary | ICD-10-CM | POA: Diagnosis not present

## 2019-07-07 DIAGNOSIS — Z20822 Contact with and (suspected) exposure to covid-19: Secondary | ICD-10-CM | POA: Diagnosis not present

## 2019-07-07 DIAGNOSIS — R0981 Nasal congestion: Secondary | ICD-10-CM

## 2019-07-07 DIAGNOSIS — R6883 Chills (without fever): Secondary | ICD-10-CM

## 2019-07-07 DIAGNOSIS — M791 Myalgia, unspecified site: Secondary | ICD-10-CM | POA: Diagnosis not present

## 2019-07-07 LAB — POCT INFLUENZA A/B
Influenza A, POC: NEGATIVE
Influenza B, POC: NEGATIVE

## 2019-07-07 LAB — POC SARS CORONAVIRUS 2 AG -  ED: SARS Coronavirus 2 Ag: NEGATIVE

## 2019-07-07 MED ORDER — ROSUVASTATIN CALCIUM 10 MG PO TABS: 10.0000 mg | ORAL_TABLET | Freq: Every day | ORAL | 0 refills | Status: DC

## 2019-07-07 NOTE — Discharge Instructions (Addendum)
Your flu test was negative.    Your rapid COVID test is negative; the send-out test is pending.  You should self quarantine until your test result is back and is negative.    Go to the emergency department if you develop high fever, shortness of breath, severe diarrhea, or other concerning symptoms.

## 2019-07-07 NOTE — ED Provider Notes (Signed)
Christopher Woods    CSN: [id number] Arrival date & time: 07/07/19  1230      History   Chief Complaint Chief Complaint  Patient presents with  . chills  . Head congestion  . Generalized Body Aches    HPI Christopher Woods. is a 64 y.o. male.   Presents with body aches, chills, congestion, rhinorrhea x2 days.  He states he initially had a sore throat but this is resolved.  He has been treating his symptoms at home with Alka-Seltzer plus.  He denies fever, cough, shortness of breath, vomiting, diarrhea, rash, or other symptoms.  He states he has a coworker who was sick with flu-like symptoms last week.  The history is provided by the patient.    Past Medical History:  Diagnosis Date  . Arthritis   . Dupuytren's disease   . High coronary artery calcium score    a. 04/2017 Cardiac CT: Cor Ca2+ = 100 (68th%'ile).  . History of kidney stones   . Lumbago   . MVP (mitral valve prolapse)   . Trigeminal neuralgia    resolved  . Type 2 diabetes mellitus without complication ([place]) 9/38/1017    Patient Active Problem List   Diagnosis Date Noted  . Encounter for screening colonoscopy   . Benign neoplasm of descending colon   . Encounter for general adult medical examination with abnormal findings 01/26/2018  . Thoracic back pain 01/26/2018  . 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 Acute back pain with sciatica 06/04/2017  . Preop examination 05/13/2017  . Left leg pain 04/12/2017  . Left testicle enlargement 12/21/2016  . Varicose veins of both lower extremities 12/21/2016  . Elevated BP without diagnosis of hypertension 12/21/2016  . Muscle strain 06/23/2016  . Confusion 03/19/2016  . Hypercholesterolemia 03/02/2016  . Diabetes mellitus type 2 in nonobese ([place]) 02/26/2016  . Enthesopathy of left hip region 07/27/2014  . Radiculopathy of lumbar region 12/15/2011  . Tear of right acetabular labrum 09/02/2011  . Pain in joint,  pelvic region and thigh 08/25/2011    Past Surgical History:  Procedure Laterality Date  . COLONOSCOPY WITH PROPOFOL N/A 02/18/2018   Procedure: COLONOSCOPY WITH PROPOFOL WITH BIOPSY;  Surgeon: Lucilla Lame, MD;  Location: [place] [facility];  Service: Endoscopy;  Laterality: N/A;  Diabetic- requests to be first  . HAND SURGERY Right    thumb reconstruction  . HIP SURGERY Left   . HIP SURGERY Right   . KNEE SURGERY Left   . KNEE SURGERY Right    meniscal tear  . LUMBAR LAMINECTOMY/DECOMPRESSION MICRODISCECTOMY Left 06/10/2017   Procedure: L4-5 disectomy left far lateral;  Surgeon: Melina Schools, MD;  Location: [place];  Service: Orthopedics;  Laterality: Left;  120 mins  . POLYPECTOMY N/A 02/18/2018   Procedure: POLYPECTOMY;  Surgeon: Lucilla Lame, MD;  Location: [place];  Service: Endoscopy;  Laterality: N/A;  . TRIGGER FINGER RELEASE Left        Home Medications    Prior to Admission medications   Medication Sig Start Date End Date Taking? Authorizing Provider  blood glucose meter kit and supplies KIT Dispense freestyle light. Use two times daily as directed. (FOR ICD-9 250.00, 250.01). 03/27/16   Leone Haven, MD  ezetimibe (ZETIA) 10 MG tablet Take 1 tablet (10 mg total) by mouth daily. 06/29/19 09/27/19  End, Harrell Gave, MD  gabapentin (NEURONTIN) 300 MG capsule Take 300 mg by mouth 2 (two) times daily.  [provider]  metFORMIN (GLUCOPHAGE) 500 MG tablet TAKE 1 TABLET (500 MG TOTAL) BY MOUTH AT BEDTIME. 03/14/19   Leone Haven, MD  Multiple Vitamin (MULTIVITAMIN) tablet Take 1 tablet by mouth daily.    [provider]  rosuvastatin (CRESTOR) 10 MG tablet Take 1 tablet (10 mg total) by mouth daily. Please call to schedule appointment for further refills. Thank you! 07/07/19   Theora Gianotti, NP    Family History Family History  Problem Relation Age of Onset  . Hypertension Mother   . Hypertension Father   . Cancer Father          prostate  . Hypertension Sister   . Cancer Sister        melanoma  . Kidney disease Sister     Social History Social History   Tobacco Use  . Smoking status: Never Smoker  . Smokeless tobacco: Never Used  Substance Use Topics  . Alcohol use: No  . Drug use: No     Allergies   Indomethacin   Review of Systems Review of Systems  Constitutional: Negative for chills and fever.  HENT: Positive for congestion and rhinorrhea. Negative for ear pain, sore throat and trouble swallowing.   Eyes: Negative for pain and visual disturbance.  Respiratory: Negative for cough and shortness of breath.   Cardiovascular: Negative for chest pain and palpitations.  Gastrointestinal: Negative for abdominal pain, diarrhea, nausea and vomiting.  Genitourinary: Negative for dysuria and hematuria.  Musculoskeletal: Negative for arthralgias and back pain.  Skin: Negative for color change and rash.  Neurological: Negative for seizures and syncope.  All other systems reviewed and are negative.    Physical Exam Triage Vital Signs ED Triage Vitals  Enc Vitals Group     BP      Pulse      Resp      Temp      Temp src      SpO2      Weight      Height      Head Circumference      Peak Flow      Pain Score      Pain Loc      Pain Edu?      Excl. in [place]?    No data found.  Updated Vital Signs BP 125/82 (BP Location: Left Arm)   Pulse 91   Temp 99.1 F (37.3 C) (Oral)   Resp 18   SpO2 95%   Visual Acuity Right Eye Distance:   Left Eye Distance:   Bilateral Distance:    Right Eye Near:   Left Eye Near:    Bilateral Near:     Physical Exam Vitals and nursing note reviewed.  Constitutional:      General: He is not in acute distress.    Appearance: He is well-developed.  HENT:     Head: Normocephalic and atraumatic.     Right Ear: Tympanic membrane normal.     Left Ear: Tympanic membrane normal.     Nose: Congestion and rhinorrhea present.     Mouth/Throat:      Mouth: Mucous membranes are moist.     Pharynx: Oropharynx is clear.  Eyes:     Conjunctiva/sclera: Conjunctivae normal.  Cardiovascular:     Rate and Rhythm: Normal rate and regular rhythm.     Heart sounds: No murmur.  Pulmonary:     Effort: Pulmonary effort is normal. No respiratory distress.  Breath sounds: Normal breath sounds.  Abdominal:     General: Bowel sounds are normal.     Palpations: Abdomen is soft.     Tenderness: There is no abdominal tenderness. There is no guarding or rebound.  Musculoskeletal:     Cervical back: Neck supple.  Skin:    General: Skin is warm and dry.     Findings: No rash.  Neurological:     General: No focal deficit present.     Mental Status: He is alert and oriented to person, place, and time.  Psychiatric:        Mood and Affect: Mood normal.        Behavior: Behavior normal.      UC Treatments / Results  Labs (all labs ordered are listed, but only abnormal results are displayed) Labs Reviewed  NOVEL CORONAVIRUS, NAA  POC SARS CORONAVIRUS 2 AG -  ED  POCT INFLUENZA A/B    EKG   Radiology No results found.  Procedures Procedures (including critical care time)  Medications Ordered in UC Medications - No data to display  Initial Impression / Assessment and Plan / UC Course  I have reviewed the triage vital signs and the nursing notes.  Pertinent labs & imaging results that were available during my care of the patient were reviewed by me and considered in my medical decision making (see chart for details).   Viral illness.  Rapid flu negative.  POC COVID negative; PCR pending.  Instructed patient to self quarantine until the test result is back and to take Tylenol as needed for fever/discomfort.  Instructed patient to go to the emergency department if he develops high fever, shortness of breath, severe diarrhea, or other concerning symptoms.  Patient agrees with plan of care.     Final Clinical Impressions(s) / UC  Diagnoses   Final diagnoses:  Viral illness     Discharge Instructions     Your flu test was negative.    Your rapid COVID test is negative; the send-out test is pending.  You should self quarantine until your test result is back and is negative.    Go to the emergency department if you develop high fever, shortness of breath, severe diarrhea, or other concerning symptoms.       ED Prescriptions    None     PDMP not reviewed this encounter.   Sharion Balloon, NP 07/07/19 1328

## 2019-07-07 NOTE — ED Triage Notes (Signed)
Patient in office today c/o since Wednesday has had bodyache,chills head congestion clear mucus. Started with sorethroat then went away.  OTC: Alka seltzer plus

## 2019-07-07 NOTE — ED Notes (Signed)
Patient in office today c/o since Wednesday has had bodyache,chills head congestion clear mucus. Started with sorethroat then went away.  OTC: Alka seltzer plus

## 2019-07-07 NOTE — Telephone Encounter (Signed)
Spoke with patient to schedule his F/U appointment with Dr. Rockey Situ. Patient states he is not feeling well and is getting tested for COVID 19 this afternoon. Patient will call back when he is feeling better.

## 2019-07-09 LAB — NOVEL CORONAVIRUS, NAA: SARS-CoV-2, NAA: NOT DETECTED

## 2019-07-26 ENCOUNTER — Other Ambulatory Visit: Payer: Self-pay | Admitting: Internal Medicine

## 2019-08-02 ENCOUNTER — Encounter: Payer: Self-pay | Admitting: Family Medicine

## 2019-08-02 ENCOUNTER — Encounter: Payer: Self-pay | Admitting: Family

## 2019-08-02 ENCOUNTER — Ambulatory Visit: Payer: BC Managed Care – PPO | Admitting: Family Medicine

## 2019-08-02 ENCOUNTER — Other Ambulatory Visit: Payer: Self-pay

## 2019-08-02 ENCOUNTER — Ambulatory Visit: Payer: BC Managed Care – PPO | Admitting: Family

## 2019-08-02 VITALS — BP 120/82 | HR 63 | Ht 73.0 in | Wt 207.0 lb

## 2019-08-02 VITALS — BP 120/80 | HR 62 | Temp 97.4°F | Ht 73.0 in | Wt 209.0 lb

## 2019-08-02 DIAGNOSIS — E119 Type 2 diabetes mellitus without complications: Secondary | ICD-10-CM

## 2019-08-02 DIAGNOSIS — R931 Abnormal findings on diagnostic imaging of heart and coronary circulation: Secondary | ICD-10-CM

## 2019-08-02 DIAGNOSIS — L608 Other nail disorders: Secondary | ICD-10-CM | POA: Diagnosis not present

## 2019-08-02 DIAGNOSIS — E7800 Pure hypercholesterolemia, unspecified: Secondary | ICD-10-CM | POA: Diagnosis not present

## 2019-08-02 DIAGNOSIS — G8929 Other chronic pain: Secondary | ICD-10-CM | POA: Insufficient documentation

## 2019-08-02 DIAGNOSIS — E782 Mixed hyperlipidemia: Secondary | ICD-10-CM

## 2019-08-02 DIAGNOSIS — M25552 Pain in left hip: Secondary | ICD-10-CM | POA: Diagnosis not present

## 2019-08-02 LAB — COMPREHENSIVE METABOLIC PANEL
ALT: 19 U/L (ref 0–53)
AST: 23 U/L (ref 0–37)
Albumin: 4 g/dL (ref 3.5–5.2)
Alkaline Phosphatase: 52 U/L (ref 39–117)
BUN: 18 mg/dL (ref 6–23)
CO2: 33 mEq/L — ABNORMAL HIGH (ref 19–32)
Calcium: 9.3 mg/dL (ref 8.4–10.5)
Chloride: 103 mEq/L (ref 96–112)
Creatinine, Ser: 0.96 mg/dL (ref 0.40–1.50)
GFR: 79.28 mL/min (ref >60.00)
Glucose, Bld: 133 mg/dL — ABNORMAL HIGH (ref 70–99)
Potassium: 4.9 mEq/L (ref 3.5–5.1)
Sodium: 138 mEq/L (ref 135–145)
Total Bilirubin: 0.7 mg/dL (ref 0.2–1.2)
Total Protein: 6.6 g/dL (ref 6.0–8.3)

## 2019-08-02 LAB — HEMOGLOBIN A1C: Hgb A1c MFr Bld: 6.4 % (ref 4.6–6.5)

## 2019-08-02 LAB — LDL CHOLESTEROL, DIRECT: Direct LDL: 58 mg/dL

## 2019-08-02 MED ORDER — EZETIMIBE 10 MG PO TABS: 10.0000 mg | ORAL_TABLET | Freq: Every day | ORAL | 1 refills | Status: DC

## 2019-08-02 MED ORDER — METFORMIN HCL 500 MG PO TABS: 500.0000 mg | ORAL_TABLET | Freq: Every day | ORAL | 1 refills | Status: DC

## 2019-08-02 MED ORDER — ROSUVASTATIN CALCIUM 10 MG PO TABS: 10.0000 mg | ORAL_TABLET | Freq: Every day | ORAL | 1 refills | Status: DC

## 2019-08-02 NOTE — Progress Notes (Signed)
Office Visit    Patient Name: Christopher Woods. Date of Encounter: 08/02/2019  Primary Care Provider:  Leone Haven, MD Primary Cardiologist:  Ida Rogue, MD Electrophysiologist:  None   Chief Complaint    Christopher Robins. is a 64 y.o. male with a hx of DM 2, varicose veins, mitral valve prolapse, coronary calcium noted on CT presents today for follow-up for risk factor modification  Past Medical History    Past Medical History:  Diagnosis Date  . Arthritis   . Dupuytren's disease   . High coronary artery calcium score    a. 04/2017 Cardiac CT: Cor Ca2+ = 100 (68th%'ile).  . History of kidney stones   . Lumbago   . MVP (mitral valve prolapse)   . Trigeminal neuralgia    resolved  . Type 2 diabetes mellitus without complication ([place]) 7/41/4239   Past Surgical History:  Procedure Laterality Date  . COLONOSCOPY WITH PROPOFOL N/A 02/18/2018   Procedure: COLONOSCOPY WITH PROPOFOL WITH BIOPSY;  Surgeon: Lucilla Lame, MD;  Location: [facility];  Service: Endoscopy;  Laterality: N/A;  Diabetic- requests to be first  . HAND SURGERY Right    thumb reconstruction  . HIP SURGERY Left   . HIP SURGERY Right   . KNEE SURGERY Left   . KNEE SURGERY Right    meniscal tear  . LUMBAR LAMINECTOMY/DECOMPRESSION MICRODISCECTOMY Left 06/10/2017   Procedure: L4-5 disectomy left far lateral;  Surgeon: Melina Schools, MD;  Location: [facility];  Service: Orthopedics;  Laterality: Left;  120 mins  . POLYPECTOMY N/A 02/18/2018   Procedure: POLYPECTOMY;  Surgeon: Lucilla Lame, MD;  Location: [facility];  Service: Endoscopy;  Laterality: N/A;  . TRIGGER FINGER RELEASE Left    Allergies  Allergies  Allergen Reactions  . Indomethacin Rash    History of Present Illness    Christopher Messer. is a 64 y.o. male with a hx of DM 2, varicose veins, mitral valve prolapse, coronary calcium on CT last seen 06/22/2018 Christopher Bayley, NP.  Additional history includes  osteoarthritis, degenerative disc disease s/p back surgery early 2019.  He underwent a CT calcium score in 2018 showing a calcium score of 100 placing him at 60th percentile.  Aggressive risk factor modification and statin therapy was advised and initiated.  Continues to work as a Oceanographer.  He does not have a formal exercise routine out of work but tells me he has recently been walking outside to and from his mother's house to help look after her cat as she is in assisted living.  Endorses eating a low-sodium, heart healthy diet.  Has been very careful about what he eats due to his diabetes.  EKGs/Labs/Other Studies Reviewed:   The following studies were reviewed today:  EKG:  EKG is ordered today.  The ekg ordered today demonstrates NSR 63 bpm with no acute ST/T wave changes.   Recent Labs: 02/01/2019: ALT 21; BUN 13; Creatinine, Ser 0.87; Hemoglobin 14.2; Platelets 203.0; Potassium 4.3; Sodium 140; TSH 1.53  Recent Lipid Panel    Component Value Date/Time   CHOL 113 02/01/2019 0836   CHOL 221 (H) 02/26/2016 0842   TRIG 32.0 02/01/2019 0836   HDL 52.30 02/01/2019 0836   HDL 55 02/26/2016 0842   CHOLHDL 2 02/01/2019 0836   VLDL 6.4 02/01/2019 0836   LDLCALC 55 02/01/2019 0836   LDLCALC 155 (H) 02/26/2016 0842   LDLDIRECT 59.0 08/03/2017 0758  Home Medications   Current Meds  Medication Sig  . Ascorbic Acid (VITAMIN C) 500 MG CHEW Chew 1 tablet by mouth daily.  . blood glucose meter kit and supplies KIT Dispense freestyle light. Use two times daily as directed. (FOR ICD-9 250.00, 250.01).  Marland Kitchen ezetimibe (ZETIA) 10 MG tablet Take 1 tablet (10 mg total) by mouth daily.  Marland Kitchen gabapentin (NEURONTIN) 300 MG capsule Take 300 mg by mouth 2 (two) times daily.  . metFORMIN (GLUCOPHAGE) 500 MG tablet Take 1 tablet (500 mg total) by mouth at bedtime.  . Multiple Vitamin (MULTIVITAMIN) tablet Take 1 tablet by mouth daily.  . rosuvastatin (CRESTOR) 10 MG tablet  Take 1 tablet (10 mg total) by mouth daily.  . Zinc 50 MG TABS Take 1 tablet by mouth daily.      Review of Systems      Review of Systems  Constitution: Negative for chills, fever and malaise/fatigue.  Cardiovascular: Negative for chest pain, dyspnea on exertion, leg swelling, near-syncope, orthopnea, palpitations and syncope.  Respiratory: Negative for cough, shortness of breath and wheezing.   Gastrointestinal: Negative for nausea and vomiting.  Neurological: Negative for dizziness, light-headedness and weakness.   All other systems reviewed and are otherwise negative except as noted above.  Physical Exam    VS:  BP 120/82 (BP Location: Left Arm, Patient Position: Sitting, Cuff Size: Normal)   Pulse 63   Ht _0  (1.854 m)   Wt 207 lb (93.9 kg)   SpO2 98%   BMI 27.31 kg/m  , BMI Body mass index is 27.31 kg/m. GEN: Well nourished, well developed, in no acute distress. HEENT: normal. Neck: Supple, no JVD, carotid bruits, or masses. Cardiac: RRR, no murmurs, rubs, or gallops. No clubbing, cyanosis, edema.  Radials/DP/PT 2+ and equal bilaterally.  Respiratory:  Respirations regular and unlabored, clear to auscultation bilaterally. GI: Soft, nontender, nondistended, BS + x 4. MS: No deformity or atrophy. Skin: Warm and dry, no rash. Neuro:  Strength and sensation are intact. Psych: Normal affect.  Assessment & Plan    1. Coronary calcium noted on CT - Ca score 100 placing him in 60th percentile late 2018. Active at work and has been walking more recently. No anginal symptoms, no indication for ischemic evaluation at this time. Continue statin and Zetia. BP well controlled. Continue low sodium, heart healthy diet.   2. HLD - Most recent LDL <70. Continue Zetia and Crestor which he tolerates well.   3. DM2 - Recent A1c 6.4. Continue Metformin as prescribed by PCP.   Disposition: Follow up in 1 year(s) with Dr. Rockey Situ or APP.    Loel Dubonnet, NP 08/02/2019, 11:11 AM

## 2019-08-02 NOTE — Assessment & Plan Note (Signed)
He will contact his orthopedist for follow-up when he is ready.

## 2019-08-02 NOTE — Assessment & Plan Note (Signed)
Check A1c.  Continue Metformin.  Encouraged him to see an ophthalmologist.

## 2019-08-02 NOTE — Patient Instructions (Signed)
Medication Instructions:  No medication changes today.  *If you need a refill on your cardiac medications before your next appointment, please call your pharmacy*  Lab Work: No lab work today.  Your labs from primary care a few months ago looked great! Your LDL or "bad cholesterol" was less than 70 which is the goal.  Testing/Procedures: Your EKG today showed normal sinus rhythm.  Follow-Up: At [facility], you and your health needs are our priority.  As part of our continuing mission to provide you with exceptional heart care, we have created designated Provider Care Teams.  These Care Teams include your primary Cardiologist (physician) and Advanced Practice Providers (APPs -  Physician Assistants and Nurse Practitioners) who all work together to provide you with the care you need, when you need it.  We recommend signing up for the patient portal called "MyChart".  Sign up information is provided on this After Visit Summary.  MyChart is used to connect with patients for Virtual Visits (Telemedicine).  Patients are able to view lab/test results, encounter notes, upcoming appointments, etc.  Non-urgent messages can be sent to your provider as well.   To learn more about what you can do with MyChart, go to [email or web address].    Your next appointment:   1 year(s)  The format for your next appointment:   In Person  Provider:    You may see Ida Rogue, MD or one of the following Advanced Practice Providers on your designated Care Team:    Murray Hodgkins, NP  Christell Faith, PA-C  Marrianne Mood, PA-C  Other Instructions  DASH Eating Plan DASH stands for "Dietary Approaches to Stop Hypertension." The DASH eating plan is a healthy eating plan that has been shown to reduce high blood pressure (hypertension). It may also reduce your risk for type 2 diabetes, heart disease, and stroke. The DASH eating plan may also help with weight loss. What are tips for following this  plan?  General guidelines  Avoid eating more than 2,300 mg (milligrams) of salt (sodium) a day. If you have hypertension, you may need to reduce your sodium intake to 1,500 mg a day.  Limit alcohol intake to no more than 1 drink a day for nonpregnant women and 2 drinks a day for men. One drink equals 12 oz of beer, 5 oz of wine, or 1 oz of hard liquor.  Work with your health care provider to maintain a healthy body weight or to lose weight. Ask what an ideal weight is for you.  Get at least 30 minutes of exercise that causes your heart to beat faster (aerobic exercise) most days of the week. Activities may include walking, swimming, or biking.  Work with your health care provider or diet and nutrition specialist (dietitian) to adjust your eating plan to your individual calorie needs. Reading food labels   Check food labels for the amount of sodium per serving. Choose foods with less than 5 percent of the Daily Value of sodium. Generally, foods with less than 300 mg of sodium per serving fit into this eating plan.  To find whole grains, look for the word "whole" as the first word in the ingredient list. Shopping  Buy products labeled as "low-sodium" or "no salt added."  Buy fresh foods. Avoid canned foods and premade or frozen meals. Cooking  Avoid adding salt when cooking. Use salt-free seasonings or herbs instead of table salt or sea salt. Check with your health care provider or pharmacist before using salt substitutes.  Do not fry foods. Cook foods using healthy methods such as baking, boiling, grilling, and broiling instead.  Cook with heart-healthy oils, such as olive, canola, soybean, or sunflower oil. Meal planning  Eat a balanced diet that includes: ? 5 or more servings of fruits and vegetables each day. At each meal, try to fill half of your plate with fruits and vegetables. ? Up to 6-8 servings of whole grains each day. ? Less than 6 oz of lean meat, poultry, or fish  each day. A 3-oz serving of meat is about the same size as a deck of cards. One egg equals 1 oz. ? 2 servings of low-fat dairy each day. ? A serving of nuts, seeds, or beans 5 times each week. ? Heart-healthy fats. Healthy fats called Omega-3 fatty acids are found in foods such as flaxseeds and coldwater fish, like sardines, salmon, and mackerel.  Limit how much you eat of the following: ? Canned or prepackaged foods. ? Food that is high in trans fat, such as fried foods. ? Food that is high in saturated fat, such as fatty meat. ? Sweets, desserts, sugary drinks, and other foods with added sugar. ? Full-fat dairy products.  Do not salt foods before eating.  Try to eat at least 2 vegetarian meals each week.  Eat more home-cooked food and less restaurant, buffet, and fast food.  When eating at a restaurant, ask that your food be prepared with less salt or no salt, if possible. What foods are recommended? The items listed may not be a complete list. Talk with your dietitian about what dietary choices are best for you. Grains Whole-grain or whole-wheat bread. Whole-grain or whole-wheat pasta. Brown rice. [place]. Bulgur. Whole-grain and low-sodium cereals. Pita bread. Low-fat, low-sodium crackers. Whole-wheat flour tortillas. Vegetables Fresh or frozen vegetables (raw, steamed, roasted, or grilled). Low-sodium or reduced-sodium tomato and vegetable juice. Low-sodium or reduced-sodium tomato sauce and tomato paste. Low-sodium or reduced-sodium canned vegetables. Fruits All fresh, dried, or frozen fruit. Canned fruit in natural juice (without added sugar). Meat and other protein foods Skinless chicken or [place]. Ground chicken or [place]. Pork with fat trimmed off. Fish and seafood. Egg whites. Dried beans, peas, or lentils. Unsalted nuts, nut butters, and seeds. Unsalted canned beans. Lean cuts of beef with fat trimmed off. Low-sodium, lean deli meat. Dairy Low-fat (1%) or fat-free  (skim) milk. Fat-free, low-fat, or reduced-fat cheeses. Nonfat, low-sodium ricotta or cottage cheese. Low-fat or nonfat yogurt. Low-fat, low-sodium cheese. Fats and oils Soft margarine without trans fats. Vegetable oil. Low-fat, reduced-fat, or light mayonnaise and salad dressings (reduced-sodium). Canola, safflower, olive, soybean, and sunflower oils. Avocado. Seasoning and other foods Herbs. Spices. Seasoning mixes without salt. Unsalted popcorn and pretzels. Fat-free sweets. What foods are not recommended? The items listed may not be a complete list. Talk with your dietitian about what dietary choices are best for you. Grains Baked goods made with fat, such as croissants, muffins, or some breads. Dry pasta or rice meal packs. Vegetables Creamed or fried vegetables. Vegetables in a cheese sauce. Regular canned vegetables (not low-sodium or reduced-sodium). Regular canned tomato sauce and paste (not low-sodium or reduced-sodium). Regular tomato and vegetable juice (not low-sodium or reduced-sodium). Angie Fava. Olives. Fruits Canned fruit in a light or heavy syrup. Fried fruit. Fruit in cream or butter sauce. Meat and other protein foods Fatty cuts of meat. Ribs. Fried meat. Berniece Salines. Sausage. Bologna and other processed lunch meats. Salami. Fatback. Hotdogs. Bratwurst. Salted nuts and seeds. Canned beans with  added salt. Canned or smoked fish. Whole eggs or egg yolks. Chicken or [place] with skin. Dairy Whole or 2% milk, cream, and half-and-half. Whole or full-fat cream cheese. Whole-fat or sweetened yogurt. Full-fat cheese. Nondairy creamers. Whipped toppings. Processed cheese and cheese spreads. Fats and oils Butter. Stick margarine. Lard. Shortening. Ghee. Bacon fat. Tropical oils, such as coconut, palm kernel, or palm oil. Seasoning and other foods Salted popcorn and pretzels. Onion salt, garlic salt, seasoned salt, table salt, and sea salt. Worcestershire sauce. Tartar sauce. Barbecue sauce.  Teriyaki sauce. Soy sauce, including reduced-sodium. Steak sauce. Canned and packaged gravies. Fish sauce. Oyster sauce. Cocktail sauce. Horseradish that you find on the shelf. Ketchup. Mustard. Meat flavorings and tenderizers. Bouillon cubes. Hot sauce and Tabasco sauce. Premade or packaged marinades. Premade or packaged taco seasonings. Relishes. Regular salad dressings. Where to find more information:  National Heart, Lung, and [place]: https://wilson-eaton.com/  American Heart Association: www.heart.org Summary  The DASH eating plan is a healthy eating plan that has been shown to reduce high blood pressure (hypertension). It may also reduce your risk for type 2 diabetes, heart disease, and stroke.  With the DASH eating plan, you should limit salt (sodium) intake to 2,300 mg a day. If you have hypertension, you may need to reduce your sodium intake to 1,500 mg a day.  When on the DASH eating plan, aim to eat more fresh fruits and vegetables, whole grains, lean proteins, low-fat dairy, and heart-healthy fats.  Work with your health care provider or diet and nutrition specialist (dietitian) to adjust your eating plan to your individual calorie needs. This information is not intended to replace advice given to you by your health care provider. Make sure you discuss any questions you have with your health care provider. Document Revised: 04/23/2017 Document Reviewed: 05/04/2016 Elsevier Patient Education  2020 [employer].

## 2019-08-02 NOTE — Patient Instructions (Signed)
Nice to see you. Please continue current medications. Please call to schedule your eye appointment. Please contact your podiatrist to schedule follow-up on your toe.

## 2019-08-02 NOTE — Assessment & Plan Note (Signed)
Continue current medications.  Check LDL and CMP.

## 2019-08-02 NOTE — Progress Notes (Signed)
Christopher Rumps, MD Phone: [phone]  Christopher Woods Hemann. is a 64 y.o. male who presents today for follow-up.  Diabetes: Not checking sugars.  Taking metformin.  No polyuria or polydipsia.  He is due for ophthalmology.  Hyperlipidemia: Taking Zetia and Crestor.  No chest pain, claudication, right upper quadrant pain, or myalgias.  Right great toenail thickness: This has been going on for at least a year.  It is dark and thickened.  He saw podiatry previously and they felt he may have dropped something on it and injured it though it has not resolved.  There have been no changes since he first noticed it.  Chronic left hip pain: Patient notes occasional discomfort in his left hip.  Aleve p.m. helps with this.  He only takes it once a week.  He had a labral tear there and has had surgery on it.  Social History   Tobacco Use  Smoking Status Never Smoker  Smokeless Tobacco Never Used     ROS see history of present illness  Objective  Physical Exam Vitals:   08/02/19 0834  BP: 120/80  Pulse: 62  Temp: (!) 97.4 F (36.3 C)  SpO2: 97%    BP Readings from Last 3 Encounters:  08/02/19 120/80  07/07/19 125/82  02/01/19 130/70   Wt Readings from Last 3 Encounters:  08/02/19 209 lb (94.8 kg)  02/01/19 211 lb 12.8 oz (96.1 kg)  06/22/18 211 lb 11 oz (96 kg)    Physical Exam Constitutional:      General: He is not in acute distress.    Appearance: He is not diaphoretic.  Cardiovascular:     Rate and Rhythm: Normal rate and regular rhythm.     Heart sounds: Normal heart sounds.  Pulmonary:     Effort: Pulmonary effort is normal.     Breath sounds: Normal breath sounds.  Musculoskeletal:     Right lower leg: No edema.     Left lower leg: No edema.     Comments: Good internal and external range of motion left hip with no pain  Skin:    General: Skin is warm and dry.  Neurological:     Mental Status: He is alert.    Diabetic Foot Exam - Simple   Simple Foot  Form Diabetic Foot exam was performed with the following findings: Yes 08/02/2019  9:01 AM  Visual Inspection See comments: Yes Sensation Testing Intact to touch and monofilament testing bilaterally: Yes Pulse Check Posterior Tibialis and Dorsalis pulse intact bilaterally: Yes Comments Right great toenail thickened slightly with darkened color, no other deformities, ulcerations, or skin breakdown bilaterally      Assessment/Plan: Please see individual problem list.  Diabetes mellitus type 2 in nonobese (HCC) Check A1c.  Continue Metformin.  Encouraged him to see an ophthalmologist.  Hypercholesterolemia Continue current medications.  Check LDL and CMP.  Chronic left hip pain He will contact his orthopedist for follow-up when he is ready.  Toenail deformity He will contact his podiatrist for an appointment.   Orders Placed This Encounter  Procedures  . Comp Met (CMET)  . Direct LDL  . HgB A1c    Meds ordered this encounter  Medications  . ezetimibe (ZETIA) 10 MG tablet    Sig: Take 1 tablet (10 mg total) by mouth daily.    Dispense:  90 tablet    Refill:  1  . metFORMIN (GLUCOPHAGE) 500 MG tablet    Sig: Take 1 tablet (500 mg total) by  mouth at bedtime.    Dispense:  90 tablet    Refill:  1  . rosuvastatin (CRESTOR) 10 MG tablet    Sig: Take 1 tablet (10 mg total) by mouth daily.    Dispense:  90 tablet    Refill:  1    This visit occurred during the SARS-CoV-2 public health emergency.  Safety protocols were in place, including screening questions prior to the visit, additional usage of staff PPE, and extensive cleaning of exam room while observing appropriate contact time as indicated for disinfecting solutions.    Christopher Rumps, MD [place]

## 2019-08-02 NOTE — Assessment & Plan Note (Signed)
He will contact his podiatrist for an appointment.

## 2019-08-12 ENCOUNTER — Ambulatory Visit: Payer: BC Managed Care – PPO | Attending: Internal Medicine

## 2019-08-12 ENCOUNTER — Other Ambulatory Visit: Payer: Self-pay

## 2019-08-12 DIAGNOSIS — Z23 Encounter for immunization: Secondary | ICD-10-CM

## 2019-08-12 NOTE — Progress Notes (Signed)
   [id number]  Name:  Christopher Woods.    MRN: [id number] DOB: 11/12/1955  08/12/2019  Christopher Woods was observed post Covid-19 immunization for 15 minutes without incident. He was provided with Vaccine Information Sheet and instruction to access the V-Safe system.   Christopher Woods was instructed to call 911 with any severe reactions post vaccine: Marland Kitchen Difficulty breathing  . Swelling of face and throat  . A fast heartbeat  . A bad rash all over body  . Dizziness and weakness   Immunizations Administered    Name Date Dose VIS Date Route   Pfizer COVID-19 Vaccine 08/12/2019  8:54 AM 0.3 mL 05/05/2019 Intramuscular   Manufacturer: [place]   Lot: [id number]   [place]: [id number]

## 2019-09-06 ENCOUNTER — Ambulatory Visit: Payer: BC Managed Care – PPO | Attending: Internal Medicine

## 2019-09-06 DIAGNOSIS — Z23 Encounter for immunization: Secondary | ICD-10-CM

## 2019-09-06 NOTE — Progress Notes (Signed)
   [id number]  Name:  Christopher Woods.    MRN: [id number] DOB: 1955-09-13  09/06/2019  Christopher Woods was observed post Covid-19 immunization for 15 minutes without incident. He was provided with Vaccine Information Sheet and instruction to access the V-Safe system.   Christopher Woods was instructed to call 911 with any severe reactions post vaccine: Marland Kitchen Difficulty breathing  . Swelling of face and throat  . A fast heartbeat  . A bad rash all over body  . Dizziness and weakness   Immunizations Administered    Name Date Dose VIS Date Route   Pfizer COVID-19 Vaccine 09/06/2019  8:36 AM 0.3 mL 05/05/2019 Intramuscular   Manufacturer: [place]   Lot: [id number]   [place]: [id number]

## 2019-09-18 DIAGNOSIS — D2261 Melanocytic nevi of right upper limb, including shoulder: Secondary | ICD-10-CM | POA: Diagnosis not present

## 2019-09-18 DIAGNOSIS — D2262 Melanocytic nevi of left upper limb, including shoulder: Secondary | ICD-10-CM | POA: Diagnosis not present

## 2019-09-18 DIAGNOSIS — R202 Paresthesia of skin: Secondary | ICD-10-CM | POA: Diagnosis not present

## 2019-09-18 DIAGNOSIS — L538 Other specified erythematous conditions: Secondary | ICD-10-CM | POA: Diagnosis not present

## 2019-09-18 DIAGNOSIS — B078 Other viral warts: Secondary | ICD-10-CM | POA: Diagnosis not present

## 2019-09-18 DIAGNOSIS — L570 Actinic keratosis: Secondary | ICD-10-CM | POA: Diagnosis not present

## 2019-09-18 DIAGNOSIS — X32XXXA Exposure to sunlight, initial encounter: Secondary | ICD-10-CM | POA: Diagnosis not present

## 2019-09-18 DIAGNOSIS — D225 Melanocytic nevi of trunk: Secondary | ICD-10-CM | POA: Diagnosis not present

## 2020-02-05 ENCOUNTER — Other Ambulatory Visit: Payer: Self-pay

## 2020-02-05 ENCOUNTER — Encounter: Payer: Self-pay | Admitting: Family Medicine

## 2020-02-05 ENCOUNTER — Ambulatory Visit (INDEPENDENT_AMBULATORY_CARE_PROVIDER_SITE_OTHER): Payer: BC Managed Care – PPO | Admitting: Family Medicine

## 2020-02-05 VITALS — BP 140/80 | HR 61 | Temp 98.2°F | Ht 73.0 in | Wt 207.8 lb

## 2020-02-05 DIAGNOSIS — Z23 Encounter for immunization: Secondary | ICD-10-CM | POA: Diagnosis not present

## 2020-02-05 DIAGNOSIS — Z0001 Encounter for general adult medical examination with abnormal findings: Secondary | ICD-10-CM

## 2020-02-05 DIAGNOSIS — Z8042 Family history of malignant neoplasm of prostate: Secondary | ICD-10-CM | POA: Diagnosis not present

## 2020-02-05 DIAGNOSIS — R030 Elevated blood-pressure reading, without diagnosis of hypertension: Secondary | ICD-10-CM | POA: Diagnosis not present

## 2020-02-05 DIAGNOSIS — E119 Type 2 diabetes mellitus without complications: Secondary | ICD-10-CM

## 2020-02-05 DIAGNOSIS — E7800 Pure hypercholesterolemia, unspecified: Secondary | ICD-10-CM

## 2020-02-05 LAB — COMPREHENSIVE METABOLIC PANEL
ALT: 19 U/L (ref 0–53)
AST: 22 U/L (ref 0–37)
Albumin: 4.3 g/dL (ref 3.5–5.2)
Alkaline Phosphatase: 56 U/L (ref 39–117)
BUN: 17 mg/dL (ref 6–23)
CO2: 30 mEq/L (ref 19–32)
Calcium: 9 mg/dL (ref 8.4–10.5)
Chloride: 102 mEq/L (ref 96–112)
Creatinine, Ser: 0.97 mg/dL (ref 0.40–1.50)
GFR: 78.21 mL/min (ref >60.00)
Glucose, Bld: 121 mg/dL — ABNORMAL HIGH (ref 70–99)
Potassium: 4.1 mEq/L (ref 3.5–5.1)
Sodium: 138 mEq/L (ref 135–145)
Total Bilirubin: 0.6 mg/dL (ref 0.2–1.2)
Total Protein: 6.7 g/dL (ref 6.0–8.3)

## 2020-02-05 LAB — MICROALBUMIN / CREATININE URINE RATIO
Creatinine,U: 69.4 mg/dL
Microalb Creat Ratio: 1 mg/g (ref 0.0–30.0)
Microalb, Ur: <0.7 mg/dL (ref 0.0–1.9)

## 2020-02-05 LAB — LIPID PANEL
Cholesterol: 126 mg/dL (ref 0–200)
HDL: 54.6 mg/dL (ref >39.00)
LDL Cholesterol: 62 mg/dL (ref 0–99)
NonHDL: 71.62
Total CHOL/HDL Ratio: 2
Triglycerides: 49 mg/dL (ref 0.0–149.0)
VLDL: 9.8 mg/dL (ref 0.0–40.0)

## 2020-02-05 LAB — PSA: PSA: 1.47 ng/mL (ref 0.10–4.00)

## 2020-02-05 LAB — HEMOGLOBIN A1C: Hgb A1c MFr Bld: 6.5 % (ref 4.6–6.5)

## 2020-02-05 NOTE — Progress Notes (Signed)
[name], MD Phone: [phone]  Christopher Woods. is a 64 y.o. male who presents today for CPE.  Diet: Pretty healthy.  Not eating out as much.  Mostly lean meats.  Does get some vegetables.  He is trying to avoid fatty foods.  No sugary drinks. Exercise: Has been walking more for exercise. Colonoscopy: 02/18/2018 with 10-year recall.  He had one hyperplastic polyp. Prostate cancer screening: Due Family history-  Prostate cancer: Father, maternal grandfather  Colon cancer: No Vaccines-   Flu: Given today  Tetanus: Up-to-date  Shingles: Due  COVID19: Up-to-date  Pneumonia: Up-to-date HIV screening: Up-to-date Hep C Screening: Up-to-date Tobacco use: No Alcohol use: No Illicit Drug use: No  Dentist: Yes Ophthalmology: Yes Memory difficulty: Patient notes at times he will forget what he went into a room for though it will come to him within several seconds of thinking of it.  No ADL issues.  No other memory issues.  Family history of pulmonary hypertension: Notes his sister had this and passed away from its adverse effects.  His mother had mild issues with this until she developed Covid earlier this year and it is gotten significantly worse.  He notes no breathing issues.   Active Ambulatory Problems    Diagnosis Date Noted  . Diabetes mellitus type 2 in nonobese ([place]) 02/26/2016  . Hypercholesterolemia 03/02/2016  . Muscle strain 06/23/2016  . Varicose veins of both lower extremities 12/21/2016  . Elevated BP without diagnosis of hypertension 12/21/2016  . Left leg pain 04/12/2017  . 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 Acute back pain with sciatica 06/04/2017  . Enthesopathy of left hip region 07/27/2014  . Pain in joint, pelvic region and thigh 08/25/2011  . Radiculopathy of lumbar region 12/15/2011  . Tear of right acetabular labrum 09/02/2011  . Encounter for general adult medical examination with abnormal findings  01/26/2018  . Thoracic back pain 01/26/2018  . Benign neoplasm of descending colon   . Chronic left hip pain 08/02/2019  . Toenail deformity 08/02/2019  . Family history of prostate cancer in father 02/05/2020   Resolved Ambulatory Problems    Diagnosis Date Noted  . Type 2 diabetes mellitus without complication ([place]) 86/57/8469  . IFG (impaired fasting glucose) 11/01/2015  . Confusion 03/19/2016  . Left testicle enlargement 12/21/2016  . Preop examination 05/13/2017  . Encounter for screening colonoscopy    Past Medical History:  Diagnosis Date  . Arthritis   . Dupuytren's disease   . High coronary artery calcium score   . History of kidney stones   . Lumbago   . MVP (mitral valve prolapse)   . Trigeminal neuralgia     Family History  Problem Relation Age of Onset  . Hypertension Mother   . Hypertension Father   . Cancer Father        prostate  . Hypertension Sister   . Cancer Sister        melanoma  . Kidney disease Sister     Social History   Socioeconomic History  . Marital status: Married    Spouse name: Not on file  . Number of children: Not on file  . Years of education: Not on file  . Highest education level: Not on file  Occupational History  . Not on file  Tobacco Use  . Smoking status: Never Smoker  . Smokeless tobacco: Never Used  Vaping Use  . Vaping Use: Never used  Substance and  Sexual Activity  . Alcohol use: No  . Drug use: No  . Sexual activity: Not on file  Other Topics Concern  . Not on file  Social History Narrative  . Not on file   Social Determinants of Health   Financial Resource Strain:   . Difficulty of Paying Living Expenses: Not on file  Food Insecurity:   . Worried About Programme researcher, broadcasting/film/video in the Last Year: Not on file  . Ran Out of Food in the Last Year: Not on file  Transportation Needs:   . Lack of Transportation (Medical): Not on file  . Lack of Transportation (Non-Medical): Not on file  Physical Activity:   .  Days of Exercise per Week: Not on file  . Minutes of Exercise per Session: Not on file  Stress:   . Feeling of Stress : Not on file  Social Connections:   . Frequency of Communication with Friends and Family: Not on file  . Frequency of Social Gatherings with Friends and Family: Not on file  . Attends Religious Services: Not on file  . Active Member of Clubs or Organizations: Not on file  . Attends Banker Meetings: Not on file  . Marital Status: Not on file  Intimate Partner Violence:   . Fear of Current or Ex-Partner: Not on file  . Emotionally Abused: Not on file  . Physically Abused: Not on file  . Sexually Abused: Not on file    ROS  General:  Negative for nexplained weight loss, fever Skin: Negative for new or changing mole, sore that won't heal HEENT: Negative for trouble hearing, trouble seeing, ringing in ears, mouth sores, hoarseness, change in voice, dysphagia. CV:  Negative for chest pain, dyspnea, edema, palpitations Resp: Negative for cough, dyspnea, hemoptysis GI: Negative for nausea, vomiting, diarrhea, constipation, abdominal pain, melena, hematochezia. GU: Negative for dysuria, incontinence, urinary hesitance, hematuria, vaginal or penile discharge, polyuria, sexual difficulty, lumps in testicle or breasts MSK: Negative for muscle cramps or aches, joint pain or swelling Neuro: Negative for headaches, weakness, numbness, dizziness, passing out/fainting Psych: Positive for memory problems, negative for depression, anxiety Objective  Physical Exam Vitals:   02/05/20 1027  BP: 140/80  Pulse: 61  Temp: 98.2 F (36.8 C)  SpO2: 99%    BP Readings from Last 3 Encounters:  02/05/20 140/80  08/02/19 120/82  08/02/19 120/80   Wt Readings from Last 3 Encounters:  02/05/20 207 lb 12.8 oz (94.3 kg)  08/02/19 207 lb (93.9 kg)  08/02/19 209 lb (94.8 kg)    Physical Exam Constitutional:      General: He is not in acute distress.    Appearance: He  is not diaphoretic.  HENT:     Head: Normocephalic and atraumatic.  Eyes:     Conjunctiva/sclera: Conjunctivae normal.     Pupils: Pupils are equal, round, and reactive to light.  Cardiovascular:     Rate and Rhythm: Normal rate and regular rhythm.     Heart sounds: Normal heart sounds.  Pulmonary:     Effort: Pulmonary effort is normal.     Breath sounds: Normal breath sounds.  Abdominal:     General: Bowel sounds are normal. There is no distension.     Palpations: Abdomen is soft.     Tenderness: There is no abdominal tenderness. There is no guarding or rebound.  Musculoskeletal:     Right lower leg: No edema.     Left lower leg: No edema.  Lymphadenopathy:  Cervical: No cervical adenopathy.  Skin:    General: Skin is warm and dry.  Neurological:     Mental Status: He is alert.  Psychiatric:        Mood and Affect: Mood normal.      Assessment/Plan:   Encounter for general adult medical examination with abnormal findings Physical exam completed.  Encouraged healthy diet and exercise.  We will check lab work today.  PSA for prostate cancer screening will be completed.  Colonoscopy is up-to-date.  I encouraged him to get the shingles vaccine and he will check with his insurance regarding coverage for this.  Blood pressure mildly elevated though he reports it is in the 128-130s/60s range at home.  We will have him check this consistently for 2 weeks and then follow-up for repeat blood pressure check at that time.  Discussed that there could be a hereditary component to the pulmonary hypertension in his family though it appears that genetic testing is somewhat controversial.  Discussed monitoring for development of any symptoms.  Elevated BP without diagnosis of hypertension Mild elevation today.  He will check daily for the next 2 weeks and follow-up for repeat BP check.   Orders Placed This Encounter  Procedures  . Flu Vaccine QUAD 36+ mos IM  . HgB A1c  . Comp Met  (CMET)  . Lipid panel  . Urine Microalbumin w/creat. ratio  . PSA    No orders of the defined types were placed in this encounter.   This visit occurred during the SARS-CoV-2 public health emergency.  Safety protocols were in place, including screening questions prior to the visit, additional usage of staff PPE, and extensive cleaning of exam room while observing appropriate contact time as indicated for disinfecting solutions.    [name], MD [place]

## 2020-02-05 NOTE — Assessment & Plan Note (Signed)
Mild elevation today.  He will check daily for the next 2 weeks and follow-up for repeat BP check.

## 2020-02-05 NOTE — Assessment & Plan Note (Addendum)
Physical exam completed.  Encouraged healthy diet and exercise.  We will check lab work today.  PSA for prostate cancer screening will be completed.  Colonoscopy is up-to-date.  I encouraged him to get the shingles vaccine and he will check with his insurance regarding coverage for this.  Blood pressure mildly elevated though he reports it is in the 128-130s/60s range at home.  We will have him check this consistently for 2 weeks and then follow-up for repeat blood pressure check at that time.  Discussed that there could be a hereditary component to the pulmonary hypertension in his family though it appears that genetic testing is somewhat controversial.  Discussed monitoring for development of any symptoms.  Lab work as outlined below.

## 2020-02-05 NOTE — Patient Instructions (Signed)
Nice to see you. Please continue to stay active and monitor your diet. Please monitor your memory and if you have any worsening issues please let us know. We will get lab work today and contact you with the results. Please consider getting the shingles vaccine.  Please check with your insurance regarding coverage for this and let us know if you would like this through the office. Please check your blood pressure daily for the next 2 weeks.  We will have you follow-up at the end of that to see how your blood pressure has been doing.

## 2020-02-10 ENCOUNTER — Other Ambulatory Visit: Payer: Self-pay | Admitting: Family Medicine

## 2020-02-15 ENCOUNTER — Other Ambulatory Visit: Payer: Self-pay | Admitting: Family Medicine

## 2020-03-17 ENCOUNTER — Other Ambulatory Visit: Payer: Self-pay | Admitting: Family Medicine

## 2020-04-01 ENCOUNTER — Encounter: Payer: Self-pay | Admitting: Family Medicine

## 2020-04-02 ENCOUNTER — Other Ambulatory Visit: Payer: Self-pay

## 2020-04-02 MED ORDER — GABAPENTIN 300 MG PO CAPS: 300.0000 mg | ORAL_CAPSULE | Freq: Two times a day (BID) | ORAL | 1 refills | Status: DC

## 2020-06-14 ENCOUNTER — Ambulatory Visit (INDEPENDENT_AMBULATORY_CARE_PROVIDER_SITE_OTHER): Payer: BC Managed Care – PPO

## 2020-06-14 ENCOUNTER — Other Ambulatory Visit: Payer: Self-pay

## 2020-06-14 ENCOUNTER — Ambulatory Visit
Admission: EM | Admit: 2020-06-14 | Discharge: 2020-06-14 | Disposition: A | Discharge status: Home / Self Care | Payer: BC Managed Care – PPO | Attending: Family Medicine | Admitting: Family Medicine

## 2020-06-14 DIAGNOSIS — U071 COVID-19: Secondary | ICD-10-CM

## 2020-06-14 DIAGNOSIS — Z1152 Encounter for screening for COVID-19: Secondary | ICD-10-CM

## 2020-06-14 DIAGNOSIS — R079 Chest pain, unspecified: Secondary | ICD-10-CM | POA: Diagnosis not present

## 2020-06-14 DIAGNOSIS — B349 Viral infection, unspecified: Secondary | ICD-10-CM | POA: Diagnosis not present

## 2020-06-14 DIAGNOSIS — R509 Fever, unspecified: Secondary | ICD-10-CM | POA: Diagnosis not present

## 2020-06-14 NOTE — Discharge Instructions (Addendum)
Your x-ray was normal.  Recommend Mucinex for symptoms.  This will help with the congestion, thick mucus.  Tylenol or ibuprofen for fever as needed. Covid test pending.

## 2020-06-14 NOTE — ED Triage Notes (Signed)
Patient presents to Urgent Care with complaints of sore, and nasal congestion. since Monday.  Patient reports he woke up with a fever this morning with a 101.0 temp. Treating symptoms with Dayquil.

## 2020-06-16 LAB — NOVEL CORONAVIRUS, NAA: SARS-CoV-2, NAA: DETECTED — AB

## 2020-06-16 LAB — SARS-COV-2, NAA 2 DAY TAT

## 2020-06-16 NOTE — ED Provider Notes (Signed)
Christopher Woods    CSN: [id number] Arrival date & time: 06/14/20  1144      History   Chief Complaint Chief Complaint  Patient presents with  . Sore Throat  . Nasal Congestion  . Headache    HPI Christopher Woods. is a 65 y.o. male.   Patient is a 65 year old male who presents today with complaints of sore throat, nasal congestion, fever.  This is been present, waxing waning since Monday.  Treating symptoms with DayQuil.  No chest pain or shortness of breath.  Possible COVID exposure     Past Medical History:  Diagnosis Date  . Arthritis   . Dupuytren's disease   . High coronary artery calcium score    a. 04/2017 Cardiac CT: Cor Ca2+ = 100 (68th%'ile).  . History of kidney stones   . Lumbago   . MVP (mitral valve prolapse)   . Trigeminal neuralgia    resolved  . Type 2 diabetes mellitus without complication ([place]) 5/62/1308    Patient Active Problem List   Diagnosis Date Noted  . Family history of prostate cancer in father 02/05/2020  . Chronic left hip pain 08/02/2019  . Toenail deformity 08/02/2019  . Benign neoplasm of descending colon   . Encounter for general adult medical examination with abnormal findings 01/26/2018  . Thoracic back pain 01/26/2018  . 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 Acute back pain with sciatica 06/04/2017  . Left leg pain 04/12/2017  . Varicose veins of both lower extremities 12/21/2016  . Elevated BP without diagnosis of hypertension 12/21/2016  . Muscle strain 06/23/2016  . Hypercholesterolemia 03/02/2016  . Diabetes mellitus type 2 in nonobese ([place]) 02/26/2016  . Enthesopathy of left hip region 07/27/2014  . Radiculopathy of lumbar region 12/15/2011  . Tear of right acetabular labrum 09/02/2011  . Pain in joint, pelvic region and thigh 08/25/2011    Past Surgical History:  Procedure Laterality Date  . COLONOSCOPY WITH PROPOFOL N/A 02/18/2018   Procedure: COLONOSCOPY WITH  PROPOFOL WITH BIOPSY;  Surgeon: Lucilla Lame, MD;  Location: [facility];  Service: Endoscopy;  Laterality: N/A;  Diabetic- requests to be first  . HAND SURGERY Right    thumb reconstruction  . HIP SURGERY Left   . HIP SURGERY Right   . KNEE SURGERY Left   . KNEE SURGERY Right    meniscal tear  . LUMBAR LAMINECTOMY/DECOMPRESSION MICRODISCECTOMY Left 06/10/2017   Procedure: L4-5 disectomy left far lateral;  Surgeon: Melina Schools, MD;  Location: [facility];  Service: Orthopedics;  Laterality: Left;  120 mins  . POLYPECTOMY N/A 02/18/2018   Procedure: POLYPECTOMY;  Surgeon: Lucilla Lame, MD;  Location: [facility];  Service: Endoscopy;  Laterality: N/A;  . TRIGGER FINGER RELEASE Left        Home Medications    Prior to Admission medications   Medication Sig Start Date End Date Taking? Authorizing Provider  Ascorbic Acid (VITAMIN C) 500 MG CHEW Chew 1 tablet by mouth daily.    [provider]  blood glucose meter kit and supplies KIT Dispense freestyle light. Use two times daily as directed. (FOR ICD-9 250.00, 250.01). 03/27/16   Leone Haven, MD  ezetimibe (ZETIA) 10 MG tablet TAKE 1 TABLET BY MOUTH EVERY DAY 02/12/20   Leone Haven, MD  gabapentin (NEURONTIN) 300 MG capsule Take 1 capsule (300 mg total) by mouth 2 (two) times daily. 04/02/20   Leone Haven, MD  metFORMIN (GLUCOPHAGE) 500 MG tablet TAKE 1 TABLET (500 MG TOTAL) BY MOUTH AT BEDTIME. 02/15/20   Leone Haven, MD  Multiple Vitamin (MULTIVITAMIN) tablet Take 1 tablet by mouth daily.    [provider]  rosuvastatin (CRESTOR) 10 MG tablet TAKE 1 TABLET BY MOUTH EVERY DAY 03/18/20   Leone Haven, MD  Zinc 50 MG TABS Take 1 tablet by mouth daily.    [provider]    Family History Family History  Problem Relation Age of Onset  . Hypertension Mother   . Hypertension Father   . Cancer Father        prostate  . Hypertension Sister   . Cancer Sister         melanoma  . Kidney disease Sister     Social History Social History   Tobacco Use  . Smoking status: Never Smoker  . Smokeless tobacco: Never Used  Vaping Use  . Vaping Use: Never used  Substance Use Topics  . Alcohol use: No  . Drug use: No     Allergies   Indomethacin   Review of Systems Review of Systems   Physical Exam Triage Vital Signs ED Triage Vitals  Enc Vitals Group     BP 06/14/20 1305 136/77     Pulse Rate 06/14/20 1305 77     Resp 06/14/20 1305 18     Temp 06/14/20 1305 (!) 97.5 F (36.4 C)     Temp Source 06/14/20 1305 Oral     SpO2 06/14/20 1305 97 %     Weight 06/14/20 1311 200 lb (90.7 kg)     Height --      Head Circumference --      Peak Flow --      Pain Score 06/14/20 1310 6     Pain Loc --      Pain Edu? --      Excl. in [place]? --    No data found.  Updated Vital Signs BP 136/77 (BP Location: Left Arm)   Pulse 77   Temp (!) 97.5 F (36.4 C) (Oral)   Resp 18   Wt 200 lb (90.7 kg)   SpO2 97%   BMI 26.39 kg/m   Visual Acuity Right Eye Distance:   Left Eye Distance:   Bilateral Distance:    Right Eye Near:   Left Eye Near:    Bilateral Near:     Physical Exam Vitals and nursing note reviewed.  Constitutional:      General: He is not in acute distress.    Appearance: Normal appearance. He is not ill-appearing, toxic-appearing or diaphoretic.  HENT:     Head: Normocephalic and atraumatic.     Right Ear: Tympanic membrane and ear canal normal.     Left Ear: Tympanic membrane and ear canal normal.     Nose: Nose normal.     Mouth/Throat:     Pharynx: Oropharynx is clear.  Eyes:     Conjunctiva/sclera: Conjunctivae normal.  Cardiovascular:     Rate and Rhythm: Normal rate and regular rhythm.  Pulmonary:     Effort: Pulmonary effort is normal.     Breath sounds: Rhonchi present.  Musculoskeletal:        General: Normal range of motion.     Cervical back: Normal range of motion.  Skin:    General: Skin is warm and dry.   Neurological:     Mental Status: He is alert.  Psychiatric:  Mood and Affect: Mood normal.      UC Treatments / Results  Labs (all labs ordered are listed, but only abnormal results are displayed) Labs Reviewed  NOVEL CORONAVIRUS, NAA - Abnormal; Notable for the following components:      Result Value   SARS-CoV-2, NAA Detected (*)    All other components within normal limits   Narrative:    Performed at:  [address], [place], [place]  [id number] Lab Director: Rush Farmer MD, Phone:  [phone]  SARS-COV-2, NAA 2 DAY TAT   Narrative:    Performed at:  [facility] [address] [place], [place]  [id number] Lab Director: Rush Farmer MD, Phone:  [phone]    EKG   Radiology No results found.  Procedures Procedures (including critical care time)  Medications Ordered in UC Medications - No data to display  Initial Impression / Assessment and Plan / UC Course  I have reviewed the triage vital signs and the nursing notes.  Pertinent labs & imaging results that were available during my care of the patient were reviewed by me and considered in my medical decision making (see chart for details).     Viral illness Chest x-ray without any acute findings.  Recommend Mucinex for cough, chest congestion and to thin mucus.  Tylenol or ibuprofen as needed for fever.  COVID test pending. Follow up as needed for continued or worsening symptoms  Final Clinical Impressions(s) / UC Diagnoses   Final diagnoses:  Viral illness     Discharge Instructions     Your x-ray was normal.  Recommend Mucinex for symptoms.  This will help with the congestion, thick mucus.  Tylenol or ibuprofen for fever as needed. Covid test pending.     ED Prescriptions    None     PDMP not reviewed this encounter.   Orvan July, NP 06/16/20 [phone]

## 2020-08-01 ENCOUNTER — Other Ambulatory Visit: Payer: Self-pay | Admitting: Family Medicine

## 2020-08-01 ENCOUNTER — Other Ambulatory Visit: Payer: Self-pay

## 2020-08-05 ENCOUNTER — Encounter: Payer: Self-pay | Admitting: Family Medicine

## 2020-08-05 ENCOUNTER — Ambulatory Visit: Payer: BC Managed Care – PPO | Admitting: Family Medicine

## 2020-08-05 ENCOUNTER — Other Ambulatory Visit: Payer: Self-pay

## 2020-08-05 VITALS — BP 130/80 | HR 63 | Temp 97.9°F | Ht 73.0 in | Wt 213.0 lb

## 2020-08-05 DIAGNOSIS — R030 Elevated blood-pressure reading, without diagnosis of hypertension: Secondary | ICD-10-CM

## 2020-08-05 DIAGNOSIS — E119 Type 2 diabetes mellitus without complications: Secondary | ICD-10-CM | POA: Diagnosis not present

## 2020-08-05 DIAGNOSIS — E7800 Pure hypercholesterolemia, unspecified: Secondary | ICD-10-CM

## 2020-08-05 DIAGNOSIS — M5416 Radiculopathy, lumbar region: Secondary | ICD-10-CM | POA: Diagnosis not present

## 2020-08-05 DIAGNOSIS — R0989 Other specified symptoms and signs involving the circulatory and respiratory systems: Secondary | ICD-10-CM

## 2020-08-05 DIAGNOSIS — E663 Overweight: Secondary | ICD-10-CM

## 2020-08-05 LAB — POCT GLYCOSYLATED HEMOGLOBIN (HGB A1C): Hemoglobin A1C: 6.2 % — AB (ref 4.0–5.6)

## 2020-08-05 NOTE — Patient Instructions (Addendum)
Nice to see you. Please check your blood pressure at home on a daily basis.  If it starts running greater than 130/80 consistently please let us know. Somebody will contact you to schedule the testing for your blood flow in your legs.  If no any contact you within the next week please let us know.

## 2020-08-05 NOTE — Assessment & Plan Note (Signed)
Check A1c.  Continue Metformin 500 mg daily at bedtime.

## 2020-08-05 NOTE — Assessment & Plan Note (Signed)
Continue Crestor 10 mg once daily and Zetia 10 mg daily.  Last cholesterol panel well controlled.

## 2020-08-05 NOTE — Assessment & Plan Note (Addendum)
BP elevated today initially though decreased on recheck he will.  Has been well controlled previously at home.  He will continue to periodically monitor at home and let us know if it is running greater than 130/80.

## 2020-08-05 NOTE — Assessment & Plan Note (Signed)
Check ABIs. 

## 2020-08-05 NOTE — Assessment & Plan Note (Signed)
Stable.  Patient can continue his as needed dosing of gabapentin.

## 2020-08-05 NOTE — Assessment & Plan Note (Signed)
Encouraged continued healthy diet and activity.

## 2020-08-05 NOTE — Progress Notes (Signed)
[name], MD Phone: [phone]  [name] Serafin [place]. is a 65 y.o. male who presents today for f/u.  DIABETES Disease Monitoring: Blood Sugar ranges-not checking  Polyuria/phagia/dipsia- no      Optho- UTD, requesting records Medications: Compliance- taking metformin Hypoglycemic symptoms- no  HYPERLIPIDEMIA Symptoms Chest pain on exertion:  no   Leg claudication:   no Medications: Compliance- taking crestor, zetia Right upper quadrant pain- no  Muscle aches- no  Overweight: Patient is active through work.  He does lots of walking.  No specific exercises given chronic back issues.  Eats lots of salads.  Has a protein shake in the morning.  Sandwich and fruit for lunch.  Radiculopathy: Patient has intermittent issues with radiculopathy that has been going on since he had his back surgery previously.  Notes he takes gabapentin intermittently for this with benefit.  Elevated blood pressure: Patient notes typically BP has been in the 120s over 70s at home though he has not checked in quite some time.  No chest pain or shortness of breath.   Social History   Tobacco Use  Smoking Status Never Smoker  Smokeless Tobacco Never Used    Current Outpatient Medications on File Prior to Visit  Medication Sig Dispense Refill  . Ascorbic Acid (VITAMIN C) 500 MG CHEW Chew 1 tablet by mouth daily.    . blood glucose meter kit and supplies KIT Dispense freestyle light. Use two times daily as directed. (FOR ICD-9 250.00, 250.01). 1 each 0  . ezetimibe (ZETIA) 10 MG tablet TAKE 1 TABLET BY MOUTH EVERY DAY 90 tablet 1  . gabapentin (NEURONTIN) 300 MG capsule Take 1 capsule (300 mg total) by mouth 2 (two) times daily. 60 capsule 1  . metFORMIN (GLUCOPHAGE) 500 MG tablet TAKE 1 TABLET (500 MG TOTAL) BY MOUTH AT BEDTIME. 90 tablet 1  . Multiple Vitamin (MULTIVITAMIN) tablet Take 1 tablet by mouth daily.    . rosuvastatin (CRESTOR) 10 MG tablet TAKE 1 TABLET BY MOUTH EVERY DAY 90 tablet 1  .  Zinc 50 MG TABS Take 1 tablet by mouth daily.     No current facility-administered medications on file prior to visit.     ROS see history of present illness  Objective  Physical Exam Vitals:   08/05/20 0820 08/05/20 0900  BP: (!) 150/80 130/80  Pulse: 63   Temp: 97.9 F (36.6 C)   SpO2: 99%     BP Readings from Last 3 Encounters:  08/05/20 130/80  06/14/20 136/77  02/05/20 140/80   Wt Readings from Last 3 Encounters:  08/05/20 213 lb (96.6 kg)  06/14/20 200 lb (90.7 kg)  02/05/20 207 lb 12.8 oz (94.3 kg)    Physical Exam Constitutional:      General: He is not in acute distress.    Appearance: He is not diaphoretic.  Cardiovascular:     Rate and Rhythm: Normal rate and regular rhythm.     Heart sounds: Normal heart sounds.  Pulmonary:     Effort: Pulmonary effort is normal.     Breath sounds: Normal breath sounds.  Musculoskeletal:     Right lower leg: No edema.     Left lower leg: No edema.  Skin:    General: Skin is warm and dry.  Neurological:     Mental Status: He is alert.    Diabetic Foot Exam - Simple   Simple Foot Form Diabetic Foot exam was performed with the following findings: Yes 08/05/2020  8:57 AM  Visual Inspection No deformities, no ulcerations, no other skin breakdown bilaterally: Yes Sensation Testing Intact to touch and monofilament testing bilaterally: Yes Pulse Check See comments: Yes Comments Difficult to palpate PT and DP pulses bilaterally, faint pulses on the right, faint PT pulse on the left, unable to palpate DP pulse on the left      Assessment/Plan: Please see individual problem list.  Problem List Items Addressed This Visit    Decreased pedal pulses    Check ABIs.      Relevant Orders   US ARTERIAL ABI (SCREENING LOWER EXTREMITY)   Diabetes mellitus type 2 in nonobese ([facility]) - Primary    Check A1c.  Continue Metformin 500 mg daily at bedtime.      Relevant Orders   POCT HgB A1C (Completed)   Elevated BP  without diagnosis of hypertension    BP elevated today initially though decreased on recheck he will.  Has been well controlled previously at home.  He will continue to periodically monitor at home and let us know if it is running greater than 130/80.      Hypercholesterolemia    Continue Crestor 10 mg once daily and Zetia 10 mg daily.  Last cholesterol panel well controlled.      Overweight    Encouraged continued healthy diet and activity.      Radiculopathy of lumbar region    Stable.  Patient can continue his as needed dosing of gabapentin.          Health Maintenance: Request records from optometry.  Encourage patient to see optometry on a yearly basis.     This visit occurred during the SARS-CoV-2 public health emergency.  Safety protocols were in place, including screening questions prior to the visit, additional usage of staff PPE, and extensive cleaning of exam room while observing appropriate contact time as indicated for disinfecting solutions.    [name], MD [place]

## 2020-08-15 ENCOUNTER — Other Ambulatory Visit: Payer: Self-pay | Admitting: Family Medicine

## 2020-08-20 ENCOUNTER — Other Ambulatory Visit: Payer: Self-pay | Admitting: Family Medicine

## 2020-08-23 ENCOUNTER — Ambulatory Visit
Admission: RE | Admit: 2020-08-23 | Discharge: 2020-08-23 | Disposition: A | Discharge status: Home / Self Care | Payer: BC Managed Care – PPO | Source: Ambulatory Visit | Attending: Family Medicine | Admitting: Family Medicine

## 2020-08-23 ENCOUNTER — Other Ambulatory Visit: Payer: Self-pay

## 2020-08-23 DIAGNOSIS — R0989 Other specified symptoms and signs involving the circulatory and respiratory systems: Secondary | ICD-10-CM | POA: Diagnosis not present

## 2020-09-05 LAB — HM DIABETES EYE EXAM

## 2020-09-19 ENCOUNTER — Other Ambulatory Visit: Payer: Self-pay | Admitting: Family Medicine

## 2020-10-14 ENCOUNTER — Encounter: Payer: Self-pay | Admitting: Podiatry

## 2020-10-14 ENCOUNTER — Ambulatory Visit: Payer: BC Managed Care – PPO | Admitting: Podiatry

## 2020-10-14 ENCOUNTER — Other Ambulatory Visit: Payer: Self-pay

## 2020-10-14 DIAGNOSIS — Q828 Other specified congenital malformations of skin: Secondary | ICD-10-CM | POA: Diagnosis not present

## 2020-10-14 DIAGNOSIS — M7752 Other enthesopathy of left foot: Secondary | ICD-10-CM | POA: Diagnosis not present

## 2020-10-14 MED ORDER — DEXAMETHASONE SODIUM PHOSPHATE 120 MG/30ML IJ SOLN
2.0000 mg | Freq: Once | INTRAMUSCULAR | Status: AC
  Administered 2020-10-14: 2 mg via INTRA_ARTICULAR

## 2020-10-14 NOTE — Progress Notes (Signed)
He presents today chief complaint of painful calluses plantar aspect of the bilateral foot left greater than right.  This 1 at here really bothers me as he refers to the lateral aspect of the fifth metatarsophalangeal joint with an area that is red and swollen.  Objective: Vital signs are stable he is alert and oriented x3 pulses are palpable.  Forefoot demonstrates multiple porokeratotic lesions plantar aspect of the left foot.  One on the lateral aspect of the fifth metatarsal head with surrounding erythema and fluctuance consistent with bursitis.  Assessment: Porokeratosis benign skin lesion with bursitis lateral fifth met head left.  Plan: Injected the area today with dexamethasone local anesthetic debrided all reactive hyperkeratotic tissue debrided all benign skin lesions.  Follow-up with me as needed

## 2020-12-06 DIAGNOSIS — M545 Low back pain, unspecified: Secondary | ICD-10-CM | POA: Diagnosis not present

## 2021-01-27 ENCOUNTER — Other Ambulatory Visit: Payer: Self-pay | Admitting: Family Medicine

## 2021-02-04 ENCOUNTER — Encounter: Payer: BC Managed Care – PPO | Admitting: Family Medicine

## 2021-02-05 ENCOUNTER — Other Ambulatory Visit: Payer: Self-pay

## 2021-02-05 ENCOUNTER — Encounter: Payer: Self-pay | Admitting: Family Medicine

## 2021-02-05 ENCOUNTER — Ambulatory Visit (INDEPENDENT_AMBULATORY_CARE_PROVIDER_SITE_OTHER): Payer: BC Managed Care – PPO | Admitting: Family Medicine

## 2021-02-05 VITALS — BP 125/80 | HR 59 | Temp 98.1°F | Ht 73.0 in | Wt 183.2 lb

## 2021-02-05 DIAGNOSIS — Z23 Encounter for immunization: Secondary | ICD-10-CM

## 2021-02-05 DIAGNOSIS — R109 Unspecified abdominal pain: Secondary | ICD-10-CM

## 2021-02-05 DIAGNOSIS — Z8042 Family history of malignant neoplasm of prostate: Secondary | ICD-10-CM

## 2021-02-05 DIAGNOSIS — E7800 Pure hypercholesterolemia, unspecified: Secondary | ICD-10-CM

## 2021-02-05 DIAGNOSIS — Z0001 Encounter for general adult medical examination with abnormal findings: Secondary | ICD-10-CM | POA: Diagnosis not present

## 2021-02-05 DIAGNOSIS — E119 Type 2 diabetes mellitus without complications: Secondary | ICD-10-CM | POA: Diagnosis not present

## 2021-02-05 DIAGNOSIS — M5416 Radiculopathy, lumbar region: Secondary | ICD-10-CM

## 2021-02-05 LAB — COMPREHENSIVE METABOLIC PANEL
ALT: 21 U/L (ref 0–53)
AST: 24 U/L (ref 0–37)
Albumin: 4.1 g/dL (ref 3.5–5.2)
Alkaline Phosphatase: 57 U/L (ref 39–117)
BUN: 13 mg/dL (ref 6–23)
CO2: 29 mEq/L (ref 19–32)
Calcium: 9.4 mg/dL (ref 8.4–10.5)
Chloride: 101 mEq/L (ref 96–112)
Creatinine, Ser: 0.91 mg/dL (ref 0.40–1.50)
GFR: 89.49 mL/min (ref >60.00)
Glucose, Bld: 120 mg/dL — ABNORMAL HIGH (ref 70–99)
Potassium: 4.4 mEq/L (ref 3.5–5.1)
Sodium: 138 mEq/L (ref 135–145)
Total Bilirubin: 0.8 mg/dL (ref 0.2–1.2)
Total Protein: 6.7 g/dL (ref 6.0–8.3)

## 2021-02-05 LAB — LIPID PANEL
Cholesterol: 124 mg/dL (ref 0–200)
HDL: 53.7 mg/dL (ref >39.00)
LDL Cholesterol: 61 mg/dL (ref 0–99)
NonHDL: 70.75
Total CHOL/HDL Ratio: 2
Triglycerides: 50 mg/dL (ref 0.0–149.0)
VLDL: 10 mg/dL (ref 0.0–40.0)

## 2021-02-05 LAB — CBC WITH DIFFERENTIAL/PLATELET
Basophils Absolute: 0 10*3/uL (ref 0.0–0.1)
Basophils Relative: 0.9 % (ref 0.0–3.0)
Eosinophils Absolute: 0.1 10*3/uL (ref 0.0–0.7)
Eosinophils Relative: 1.3 % (ref 0.0–5.0)
HCT: 42.7 % (ref 39.0–52.0)
Hemoglobin: 14.4 g/dL (ref 13.0–17.0)
Lymphocytes Relative: 26.2 % (ref 12.0–46.0)
Lymphs Abs: 1.3 10*3/uL (ref 0.7–4.0)
MCHC: 33.8 g/dL (ref 30.0–36.0)
MCV: 93.7 fl (ref 78.0–100.0)
Monocytes Absolute: 0.5 10*3/uL (ref 0.1–1.0)
Monocytes Relative: 10 % (ref 3.0–12.0)
Neutro Abs: 3 10*3/uL (ref 1.4–7.7)
Neutrophils Relative %: 61.6 % (ref 43.0–77.0)
Platelets: 218 10*3/uL (ref 150.0–400.0)
RBC: 4.55 Mil/uL (ref 4.22–5.81)
RDW: 13 % (ref 11.5–15.5)
WBC: 4.9 10*3/uL (ref 4.0–10.5)

## 2021-02-05 LAB — HEMOGLOBIN A1C: Hgb A1c MFr Bld: 6.5 % (ref 4.6–6.5)

## 2021-02-05 LAB — MICROALBUMIN / CREATININE URINE RATIO
Creatinine,U: 24.1 mg/dL
Microalb Creat Ratio: 2.9 mg/g (ref 0.0–30.0)
Microalb, Ur: <0.7 mg/dL (ref 0.0–1.9)

## 2021-02-05 LAB — PSA: PSA: 1.48 ng/mL (ref 0.10–4.00)

## 2021-02-05 LAB — HM DIABETES EYE EXAM

## 2021-02-05 NOTE — Progress Notes (Signed)
[name], MD Phone: [phone]  Christopher Woods. is a 65 y.o. male who presents today for CPE.  Diet: stable. Generally healthy, no soda, no sweet tea. Exercise: walking. His spine specialist advised no weight training Colonoscopy: UTD 02/18/18, 10 year recall, hyperplastic polyps Prostate cancer screening: due Family history-  Prostate cancer: father, maternal grandfather  Colon cancer: no Vaccines-   Flu: given today  Tetanus: UTD  Shingles: due  COVID19: due for 4th booster HIV screening: UTD Hep C Screening: UTD Tobacco use: no Alcohol use: no Illicit Drug use: no Dentist: yes Ophthalmology: yes  Right-sided abdominal pain: This is an intermittent issue for comfort.  Notes an extensive evaluation previously and was advised it was a muscle strain.  Notes more recently has become somewhat bothersome after he eats.   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 06/10/2017   Enthesopathy of left hip region 07/27/2014   Radiculopathy of lumbar region 12/15/2011   Encounter for general adult medical examination with abnormal findings 01/26/2018   Benign neoplasm of descending colon    Chronic left hip pain 08/02/2019   Toenail deformity 08/02/2019   Family history of prostate cancer in father 02/05/2020   Overweight 08/05/2020   Decreased pedal pulses 08/05/2020   DDD (degenerative disc disease), cervical 12/14/2017   Chronic neck pain 12/14/2017   Impingement syndrome of left shoulder region 02/24/2019   Abdominal pain 02/05/2021   Resolved Ambulatory Problems    Diagnosis Date Noted   Type 2 diabetes mellitus without complication ([place]) 47/65/4650   IFG (impaired fasting glucose)  11/01/2015   Confusion 03/19/2016   Muscle strain 06/23/2016   Left testicle enlargement 12/21/2016   Left leg pain 04/12/2017   Preop examination 05/13/2017   Acute back pain with sciatica 06/04/2017   Pain in joint, pelvic region and thigh 08/25/2011   Tear of right acetabular labrum 09/02/2011   Thoracic back pain 01/26/2018   Encounter for screening colonoscopy    Diabetes mellitus ([place]) 03/03/2019   Past Medical History:  Diagnosis Date   Arthritis    Dupuytren's disease    High coronary artery calcium score    History of kidney stones    Lumbago    MVP (mitral valve prolapse)    Trigeminal neuralgia     Family History  Problem Relation Age of Onset   Hypertension Mother    Hypertension Father    Cancer Father        prostate   Hypertension Sister    Cancer Sister        melanoma   Kidney disease Sister     Social History   Socioeconomic History   Marital status: Married    Spouse name: Not on file   Number of children: Not on file   Years of education: Not on file   Highest education level: Not on file  Occupational History   Not on file  Tobacco Use   Smoking status: Never   Smokeless tobacco: Never  Vaping Use   Vaping Use: Never used  Substance and Sexual Activity   Alcohol use: No   Drug use: No   Sexual activity: Not on file  Other Topics Concern   Not on file  Social History Narrative   Not on file   Social Determinants of Health   Financial Resource Strain: Not on file  Food Insecurity: Not on file  Transportation Needs: Not on file  Physical Activity: Not on file  Stress: Not on file  Social Connections: Not on file  Intimate Partner Violence: Not on file    ROS  General:  Negative for nexplained weight loss, fever Skin: Negative for new or changing mole, sore that won't heal HEENT: Negative for trouble hearing, trouble seeing, ringing in ears, mouth sores, hoarseness, change in voice, dysphagia. CV:  Negative for chest pain,  dyspnea, edema, palpitations Resp: Negative for cough, dyspnea, hemoptysis GI: Positive for abdominal pain, negative for nausea, vomiting, diarrhea, constipation, melena, hematochezia. GU: Negative for dysuria, incontinence, urinary hesitance, hematuria, vaginal or penile discharge, polyuria, sexual difficulty, lumps in testicle or breasts MSK: Negative for muscle cramps or aches, joint pain or swelling Neuro: Negative for headaches, weakness, numbness, dizziness, passing out/fainting Psych: Negative for depression, anxiety, memory problems  Objective  Physical Exam Vitals:   02/05/21 0845  BP: 125/80  Pulse: (!) 59  Temp: 98.1 F (36.7 C)  SpO2: 99%    BP Readings from Last 3 Encounters:  02/05/21 125/80  08/05/20 130/80  06/14/20 136/77   Wt Readings from Last 3 Encounters:  02/05/21 183 lb 3 oz (83.1 kg)  08/05/20 213 lb (96.6 kg)  06/14/20 200 lb (90.7 kg)    Physical Exam Constitutional:      General: He is not in acute distress.    Appearance: He is not diaphoretic.  HENT:     Head: Normocephalic and atraumatic.  Eyes:     Conjunctiva/sclera: Conjunctivae normal.     Pupils: Pupils are equal, round, and reactive to light.  Cardiovascular:     Rate and Rhythm: Normal rate and regular rhythm.     Heart sounds: Normal heart sounds.  Pulmonary:     Effort: Pulmonary effort is normal.     Breath sounds: Normal breath sounds.  Abdominal:     General: Bowel sounds are normal. There is no distension.     Palpations: Abdomen is soft.     Tenderness: There is no guarding or rebound.    Musculoskeletal:     Right lower leg: No edema.     Left lower leg: No edema.  Lymphadenopathy:     Cervical: No cervical adenopathy.  Skin:    General: Skin is warm and dry.  Neurological:     Mental Status: He is alert.  Psychiatric:        Mood and Affect: Mood normal.     Assessment/Plan:   Problem List Items Addressed This Visit     Abdominal pain    This is a  chronic intermittent issue that is somewhat more prominent recently.  Undetermined cause.  We will start with lab work and then consider additional imaging.      Relevant Orders   CBC w/Diff   RESOLVED: Diabetes mellitus ([place])   Relevant Orders   HgB A1c   Urine Microalbumin w/creat. ratio   Encounter for general adult medical examination with abnormal findings - Primary    Physical exam completed.  Encouraged healthy diet and exercise.  We will screen for prostate cancer today.  Discussed his colon cancer screening is up-to-date.  I encouraged him to get the Shingrix vaccine.  He will check with his insurance regarding coverage.  I encouraged him to get the bivalent COVID-vaccine when  it becomes available.  Lab work as outlined.      Family history of prostate cancer in father   Relevant Orders   PSA   Hypercholesterolemia   Relevant Orders   Comp Met (CMET)   Lipid panel   Radiculopathy of lumbar region    Reports he was recently treated by his spine specialist for back strain.  He took prednisone and notes significant improvement in chronic back and joint issues while on the prednisone.  I advised that prednisone is not a safe long-term option for this.  He can use occasional over-the-counter NSAIDs for treatment of his back issue.  If he has to use this daily he will let us know so we can follow-up on his kidney function.      Other Visit Diagnoses     Need for immunization against influenza       Relevant Orders   Flu Vaccine QUAD 66mo+IM (Fluarix, Fluzone & Alfiuria Quad PF) (Completed)       Return in about 6 months (around 08/05/2021) for DM.  This visit occurred during the SARS-CoV-2 public health emergency.  Safety protocols were in place, including screening questions prior to the visit, additional usage of staff PPE, and extensive cleaning of exam room while observing appropriate contact time as indicated for disinfecting solutions.    [name], MD [place]

## 2021-02-05 NOTE — Assessment & Plan Note (Signed)
This is a chronic intermittent issue that is somewhat more prominent recently.  Undetermined cause.  We will start with lab work and then consider additional imaging.

## 2021-02-05 NOTE — Patient Instructions (Signed)
Nice to see you. We will get lab work today.  

## 2021-02-05 NOTE — Assessment & Plan Note (Signed)
Physical exam completed.  Encouraged healthy diet and exercise.  We will screen for prostate cancer today.  Discussed his colon cancer screening is up-to-date.  I encouraged him to get the Shingrix vaccine.  He will check with his insurance regarding coverage.  I encouraged him to get the bivalent COVID-vaccine when it becomes available.  Lab work as outlined.

## 2021-02-05 NOTE — Assessment & Plan Note (Signed)
Reports he was recently treated by his spine specialist for back strain.  He took prednisone and notes significant improvement in chronic back and joint issues while on the prednisone.  I advised that prednisone is not a safe long-term option for this.  He can use occasional over-the-counter NSAIDs for treatment of his back issue.  If he has to use this daily he will let us know so we can follow-up on his kidney function.

## 2021-02-10 ENCOUNTER — Other Ambulatory Visit: Payer: Self-pay | Admitting: Family Medicine

## 2021-02-10 DIAGNOSIS — R1011 Right upper quadrant pain: Secondary | ICD-10-CM

## 2021-02-11 ENCOUNTER — Other Ambulatory Visit: Payer: Self-pay | Admitting: Family Medicine

## 2021-02-13 ENCOUNTER — Other Ambulatory Visit: Payer: Self-pay | Admitting: Family Medicine

## 2021-02-18 ENCOUNTER — Ambulatory Visit: Payer: BC Managed Care – PPO

## 2021-02-24 ENCOUNTER — Ambulatory Visit
Admission: RE | Admit: 2021-02-24 | Discharge: 2021-02-24 | Disposition: A | Discharge status: Home / Self Care | Payer: BC Managed Care – PPO | Source: Ambulatory Visit | Attending: Family Medicine | Admitting: Family Medicine

## 2021-02-24 ENCOUNTER — Other Ambulatory Visit: Payer: Self-pay

## 2021-02-24 DIAGNOSIS — R1011 Right upper quadrant pain: Secondary | ICD-10-CM | POA: Diagnosis not present

## 2021-02-24 DIAGNOSIS — N281 Cyst of kidney, acquired: Secondary | ICD-10-CM | POA: Diagnosis not present

## 2021-02-24 DIAGNOSIS — R109 Unspecified abdominal pain: Secondary | ICD-10-CM | POA: Diagnosis not present

## 2021-03-27 ENCOUNTER — Other Ambulatory Visit: Payer: Self-pay | Admitting: Family Medicine

## 2021-07-26 ENCOUNTER — Other Ambulatory Visit: Payer: Self-pay | Admitting: Family Medicine

## 2021-08-06 ENCOUNTER — Ambulatory Visit: Payer: BC Managed Care – PPO | Admitting: Family Medicine

## 2021-08-15 ENCOUNTER — Other Ambulatory Visit: Payer: Self-pay | Admitting: Family Medicine

## 2021-09-15 ENCOUNTER — Encounter: Payer: Self-pay | Admitting: Family Medicine

## 2021-09-15 ENCOUNTER — Ambulatory Visit: Payer: BC Managed Care – PPO | Admitting: Family Medicine

## 2021-09-15 VITALS — BP 130/78 | HR 65 | Temp 97.9°F | Resp 14 | Ht 73.0 in | Wt 218.2 lb

## 2021-09-15 DIAGNOSIS — E785 Hyperlipidemia, unspecified: Secondary | ICD-10-CM | POA: Diagnosis not present

## 2021-09-15 DIAGNOSIS — E119 Type 2 diabetes mellitus without complications: Secondary | ICD-10-CM

## 2021-09-15 DIAGNOSIS — E1169 Type 2 diabetes mellitus with other specified complication: Secondary | ICD-10-CM

## 2021-09-15 DIAGNOSIS — R109 Unspecified abdominal pain: Secondary | ICD-10-CM

## 2021-09-15 LAB — POCT GLYCOSYLATED HEMOGLOBIN (HGB A1C): Hemoglobin A1C: 6.3 % — AB (ref 4.0–5.6)

## 2021-09-15 NOTE — Assessment & Plan Note (Signed)
Well-controlled.  He will continue metformin 500 mg daily. ?

## 2021-09-15 NOTE — Progress Notes (Signed)
?[name], MD ?Phone: [phone] ? ?Christopher Woods. is a 66 y.o. male who presents today for f/u ? ?DIABETES ?Disease Monitoring: ?Blood Sugar ranges-not checking Polyuria/phagia/dipsia- no      Optho- UTD ?Medications: ?Compliance- taking metformin Hypoglycemic symptoms- no ? ?HYPERLIPIDEMIA ?Symptoms ?Chest pain on exertion:  no   Leg claudication:   no ?Medications: ?Compliance- taking zetia and crestor Right upper quadrant pain- see below  Muscle aches- no ?Lipid Panel  ?   ?Component Value Date/Time  ? CHOL 124 02/05/2021 0911  ? CHOL 221 (H) 02/26/2016 0998  ? TRIG 50.0 02/05/2021 0911  ? HDL 53.70 02/05/2021 0911  ? HDL 55 02/26/2016 0842  ? CHOLHDL 2 02/05/2021 0911  ? VLDL 10.0 02/05/2021 0911  ? [place] 61 02/05/2021 0911  ? [place] 155 (H) 02/26/2016 3382  ? LDLDIRECT 58.0 08/02/2019 0914  ? LABVLDL 11 02/26/2016 0842  ? ?Right-sided abdominal discomfort: This continues to occur though he notes it is only occurring after he has been lifting things.  He notes no nausea or vomiting.  No diarrhea.  No blood in stool or urine.  Notes that actually has not been bothering him as much as it was previously.  Prior ultrasound was unremarkable for cause. ? ? ?Social History  ? ?Tobacco Use  ?Smoking Status Never  ?Smokeless Tobacco Never  ? ? ?Current Outpatient Medications on File Prior to Visit  ?Medication Sig Dispense Refill  ? Ascorbic Acid (VITAMIN C) 500 MG CHEW Chew 1 tablet by mouth daily.    ? blood glucose meter kit and supplies KIT Dispense freestyle light. Use two times daily as directed. (FOR ICD-9 250.00, 250.01). 1 each 0  ? ezetimibe (ZETIA) 10 MG tablet TAKE 1 TABLET BY MOUTH EVERY DAY 90 tablet 1  ? gabapentin (NEURONTIN) 300 MG capsule TAKE 1 CAPSULE BY MOUTH TWICE A DAY 60 capsule 1  ? metFORMIN (GLUCOPHAGE) 500 MG tablet TAKE 1 TABLET BY MOUTH EVERYDAY AT BEDTIME 90 tablet 1  ? Multiple Vitamin (MULTIVITAMIN) tablet Take 1 tablet by mouth daily.    ? rosuvastatin (CRESTOR) 10  MG tablet TAKE 1 TABLET BY MOUTH EVERY DAY 90 tablet 1  ? Zinc 50 MG TABS Take 1 tablet by mouth daily.    ? ?No current facility-administered medications on file prior to visit.  ? ? ? ?ROS see history of present illness ? ?Objective ? ?Physical Exam ?Vitals:  ? 09/15/21 0818  ?BP: 130/78  ?Pulse: 65  ?Resp: 14  ?Temp: 97.9 ?F (36.6 ?C)  ?SpO2: 97%  ? ? ?BP Readings from Last 3 Encounters:  ?09/15/21 130/78  ?02/05/21 125/80  ?08/05/20 130/80  ? ?Wt Readings from Last 3 Encounters:  ?09/15/21 218 lb 3.2 oz (99 kg)  ?02/05/21 183 lb 3 oz (83.1 kg)  ?08/05/20 213 lb (96.6 kg)  ? ? ?Physical Exam ?Constitutional:   ?   General: He is not in acute distress. ?   Appearance: He is not diaphoretic.  ?Cardiovascular:  ?   Rate and Rhythm: Normal rate and regular rhythm.  ?   Heart sounds: Normal heart sounds.  ?Pulmonary:  ?   Effort: Pulmonary effort is normal.  ?   Breath sounds: Normal breath sounds.  ?Abdominal:  ?   General: Bowel sounds are normal. There is no distension.  ?   Palpations: Abdomen is soft.  ?   Tenderness: There is no abdominal tenderness.  ?Skin: ?   General: Skin is warm and dry.  ?Neurological:  ?  Mental Status: He is alert.  ? ?Diabetic Foot Exam - Simple   ?Simple Foot Form ?Diabetic Foot exam was performed with the following findings: Yes 09/15/2021  8:23 AM  ?Visual Inspection ?No deformities, no ulcerations, no other skin breakdown bilaterally: Yes ?Sensation Testing ?Intact to touch and monofilament testing bilaterally: Yes ?Pulse Check ?Posterior Tibialis and Dorsalis pulse intact bilaterally: Yes ?Comments ?  ? ? ? ?Assessment/Plan: Please see individual problem list. ? ?Problem List Items Addressed This Visit   ? ? Abdominal pain  ?  Ongoing issue.  Given his description of it worsening after lifting things it could be related to a musculoskeletal issue.  Discussed that I could not rule out an intra-abdominal process at this point without a CT scan.  He defers any imaging at this time and  opts to monitor.  If it all worsens or changes he will let us know and we can get a CT scan. ? ?  ?  ? Diabetes mellitus type 2 in nonobese [facility])  ?  Well-controlled.  He will continue metformin 500 mg daily. ? ?  ?  ? Hyperlipidemia associated with type 2 diabetes mellitus ([place])  ?  Well-controlled on most recent check.  He will continue Zetia 10 mg once daily and Crestor 10 mg once daily. ? ?  ?  ? ?Other Visit Diagnoses   ? ? Type 2 diabetes mellitus without complication, without long-term current use of insulin ([place])    -  Primary  ? Relevant Orders  ? POCT glycosylated hemoglobin (Hb A1C) (Completed)  ? ?  ? ? ?Return in about 5 months (around 02/15/2022) for CPE after 02/05/2022. ? ?This visit occurred during the SARS-CoV-2 public health emergency.  Safety protocols were in place, including screening questions prior to the visit, additional usage of staff PPE, and extensive cleaning of exam room while observing appropriate contact time as indicated for disinfecting solutions.  ? ? ?[name], MD ?[place] ? ?

## 2021-09-15 NOTE — Patient Instructions (Signed)
Nice to see you. ?Your A1c was well controlled. ?Please continue your current medications. ?If the abdominal discomfort at all worsens or changes please let us know right away. ?

## 2021-09-15 NOTE — Assessment & Plan Note (Signed)
Well-controlled on most recent check.  He will continue Zetia 10 mg once daily and Crestor 10 mg once daily. ?

## 2021-09-15 NOTE — Assessment & Plan Note (Signed)
Ongoing issue.  Given his description of it worsening after lifting things it could be related to a musculoskeletal issue.  Discussed that I could not rule out an intra-abdominal process at this point without a CT scan.  He defers any imaging at this time and opts to monitor.  If it all worsens or changes he will let us know and we can get a CT scan. ?

## 2021-09-20 ENCOUNTER — Other Ambulatory Visit: Payer: Self-pay | Admitting: Family Medicine

## 2021-10-08 NOTE — Progress Notes (Signed)
Cardiology Office Note  Date:  10/13/2021   ID:  Christopher Woods., DOB Sep 22, 1955, MRN [id number]  PCP:  Leone Haven, MD   Chief Complaint  Patient presents with   12 month follow up     "Doing well." Medications reviewed by the patient verbally.     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  Lab work reviewed A1c 6.3 Total cholesterol 124 LDL 61 On Crestor 10 with Zetia  No prior smoking history No significant family history of coronary disease  EKG personally reviewed by myself on todays visit Shows normal sinus rhythm rate 64 bpm with no significant ST or T wave changes  Other past medical hx He has tried Crestor 20 mg in the past and felt fuzzy headed Try Lipitor 40 mg daily also thinks he felt funny, fuzzy headed No problem on zetia, but thought maybe it was not helping, was only on it for a short period of time  Denies any shortness of breath or chest pain on exertion Recently having left hip pain radiating down his leg Worse after moving mattresses this past Saturday Presents in a wheelchair secondary to pain down the leg, sharp, stabbing  pain running below the knee from the buttock area  Previously seen by [facility] for his hands   PMH:   has a past medical history of Arthritis, Dupuytren's disease, High coronary  artery calcium score, History of kidney stones, Lumbago, MVP (mitral valve prolapse), Trigeminal neuralgia, and Type 2 diabetes mellitus without complication ([place]) (8/58/8502).  PSH:    Past Surgical History:  Procedure Laterality Date   COLONOSCOPY WITH PROPOFOL N/A 02/18/2018   Procedure: COLONOSCOPY WITH PROPOFOL WITH BIOPSY;  Surgeon: Lucilla Lame, MD;  Location: [place];  Service: Endoscopy;  Laterality: N/A;  Diabetic- requests to be first   HAND SURGERY Right    thumb reconstruction   HIP SURGERY Left    HIP SURGERY Right    KNEE SURGERY Left    KNEE SURGERY Right    meniscal tear   LUMBAR LAMINECTOMY/DECOMPRESSION MICRODISCECTOMY Left 06/10/2017   Procedure: L4-5 disectomy left far lateral;  Surgeon: Melina Schools, MD;  Location: [place];  Service: Orthopedics;  Laterality: Left;  120 mins   POLYPECTOMY N/A 02/18/2018   Procedure: POLYPECTOMY;  Surgeon: Lucilla Lame, MD;  Location: [place];  Service: Endoscopy;  Laterality: N/A;   TRIGGER FINGER RELEASE Left     Current Outpatient Medications  Medication Sig Dispense Refill   Ascorbic Acid (VITAMIN C) 500 MG CHEW Chew 1 tablet by mouth daily.     blood glucose meter kit and supplies KIT Dispense freestyle light. Use two times daily as directed. (FOR ICD-9 250.00, 250.01). 1 each 0   ezetimibe (ZETIA) 10 MG tablet TAKE 1 TABLET BY MOUTH EVERY DAY 90 tablet 1   gabapentin (NEURONTIN) 300 MG capsule TAKE 1  CAPSULE BY MOUTH TWICE A DAY 60 capsule 1   metFORMIN (GLUCOPHAGE) 500 MG tablet TAKE 1 TABLET BY MOUTH EVERYDAY AT BEDTIME 90 tablet 1   Multiple Vitamin (MULTIVITAMIN) tablet Take 1 tablet by mouth daily.     rosuvastatin (CRESTOR) 10 MG tablet TAKE 1 TABLET BY MOUTH EVERY DAY 90 tablet 1   Zinc 50 MG TABS Take 1 tablet by mouth daily.     No current facility-administered medications for this visit.     Allergies:   Indomethacin   Social History:  The patient  reports that he has never smoked. He has  never used smokeless tobacco. He reports that he does not drink alcohol and does not use drugs.   Family History:   family history includes Cancer in his father and sister; Hypertension in his father, mother, and sister; Kidney disease in his sister.    Review of Systems: Review of Systems  Constitutional: Negative.   HENT: Negative.    Respiratory: Negative.    Cardiovascular: Negative.   Gastrointestinal: Negative.   Musculoskeletal: Negative.   Neurological: Negative.   Psychiatric/Behavioral: Negative.    All other systems reviewed and are negative.   PHYSICAL EXAM: VS:  BP (!) 152/80 (BP Location: Left Arm, Patient Position: Sitting, Cuff Size: Normal)   Pulse 64   Ht _0  (1.854 m)   Wt 216 lb 6 oz (98.1 kg)   SpO2 98%   BMI 28.55 kg/m  , BMI Body mass index is 28.55 kg/m. GEN: Well nourished, well developed, in no acute distress, presenting in a wheelchair secondary to severe leg pain HEENT: normal  Neck: no JVD, carotid bruits, or masses Cardiac: RRR; no murmurs, rubs, or gallops,no edema  Respiratory:  clear to auscultation bilaterally, normal work of breathing GI: soft, nontender, nondistended, + BS MS: no deformity or atrophy  Skin: warm and dry, no rash Neuro:  Strength and sensation are intact Psych: euthymic mood, full affect    Recent Labs: 02/05/2021: ALT 21; BUN 13; Creatinine, Ser 0.91; Hemoglobin 14.4; Platelets 218.0; Potassium 4.4; Sodium 138    Lipid Panel Lab Results  Component Value Date   CHOL 124 02/05/2021   HDL 53.70 02/05/2021   LDLCALC 61 02/05/2021   TRIG 50.0 02/05/2021      Wt Readings from Last 3 Encounters:  10/13/21 216 lb 6 oz (98.1 kg)  09/15/21 218 lb 3.2 oz (99 kg)  02/05/21 183 lb 3 oz (83.1 kg)      ASSESSMENT AND PLAN:  Diabetes mellitus type 2 in nonobese ([facility]) -  A1c relatively well controlled  Hypercholesterolemia - Plan: EKG 12-Lead, CT CARDIAC SCORING Risk factors including hyperlipidemia,  diabetes Calcium score of 102 in 2018 Cholesterol at goal  Elevated BP without diagnosis of hypertension Elevated numbers on arrival, recommend he monitor blood pressure closely at home Was well controlled on recent visit with primary care   Total encounter time more than 30 minutes  Greater than 50% was spent in counseling and coordination of care with the patient   Orders Placed This Encounter  Procedures   EKG 12-Lead     Signed, Esmond Plants, M.D., Ph.D. 10/13/2021  [place], [place] [phone]

## 2021-10-13 ENCOUNTER — Encounter: Payer: Self-pay | Admitting: Cardiovascular Disease

## 2021-10-13 ENCOUNTER — Ambulatory Visit (INDEPENDENT_AMBULATORY_CARE_PROVIDER_SITE_OTHER): Payer: BC Managed Care – PPO | Admitting: Cardiovascular Disease

## 2021-10-13 VITALS — BP 152/80 | HR 64 | Ht 73.0 in | Wt 216.4 lb

## 2021-10-13 DIAGNOSIS — E1169 Type 2 diabetes mellitus with other specified complication: Secondary | ICD-10-CM | POA: Diagnosis not present

## 2021-10-13 DIAGNOSIS — I2510 Atherosclerotic heart disease of native coronary artery without angina pectoris: Secondary | ICD-10-CM

## 2021-10-13 DIAGNOSIS — E785 Hyperlipidemia, unspecified: Secondary | ICD-10-CM | POA: Diagnosis not present

## 2021-10-13 NOTE — Patient Instructions (Addendum)
Medication Instructions:  No changes  If you need a refill on your cardiac medications before your next appointment, please call your pharmacy.   Lab work: No new labs needed  Testing/Procedures: No new testing needed  Follow-Up: At [facility], you and your health needs are our priority.  As part of our continuing mission to provide you with exceptional heart care, we have created designated Provider Care Teams.  These Care Teams include your primary Cardiologist (physician) and Advanced Practice Providers (APPs -  Physician Assistants and Nurse Practitioners) who all work together to provide you with the care you need, when you need it.  You will need a follow up appointment as needed  Providers on your designated Care Team:   Christopher Berge, NP Ryan Dunn, PA-C Cadence Furth, PA-C  COVID-19 Vaccine Information can be found at: [email or web address] For questions related to vaccine distribution or appointments, please email [email or web address] or call [phone].    

## 2021-12-26 DIAGNOSIS — M7542 Impingement syndrome of left shoulder: Secondary | ICD-10-CM | POA: Diagnosis not present

## 2022-01-23 ENCOUNTER — Other Ambulatory Visit: Payer: Self-pay | Admitting: Family Medicine

## 2022-01-24 ENCOUNTER — Other Ambulatory Visit: Payer: Self-pay | Admitting: Family Medicine

## 2022-01-30 ENCOUNTER — Other Ambulatory Visit: Payer: Self-pay | Admitting: Family Medicine

## 2022-02-02 DIAGNOSIS — M7542 Impingement syndrome of left shoulder: Secondary | ICD-10-CM | POA: Diagnosis not present

## 2022-02-16 ENCOUNTER — Encounter: Payer: Self-pay | Admitting: Family Medicine

## 2022-02-16 ENCOUNTER — Ambulatory Visit (INDEPENDENT_AMBULATORY_CARE_PROVIDER_SITE_OTHER): Payer: BC Managed Care – PPO | Admitting: Family Medicine

## 2022-02-16 VITALS — BP 130/80 | HR 65 | Temp 97.8°F | Ht 73.0 in | Wt 220.8 lb

## 2022-02-16 DIAGNOSIS — E785 Hyperlipidemia, unspecified: Secondary | ICD-10-CM

## 2022-02-16 DIAGNOSIS — Z0000 Encounter for general adult medical examination without abnormal findings: Secondary | ICD-10-CM

## 2022-02-16 DIAGNOSIS — Z23 Encounter for immunization: Secondary | ICD-10-CM | POA: Diagnosis not present

## 2022-02-16 DIAGNOSIS — E119 Type 2 diabetes mellitus without complications: Secondary | ICD-10-CM | POA: Diagnosis not present

## 2022-02-16 DIAGNOSIS — E1169 Type 2 diabetes mellitus with other specified complication: Secondary | ICD-10-CM

## 2022-02-16 DIAGNOSIS — Z125 Encounter for screening for malignant neoplasm of prostate: Secondary | ICD-10-CM

## 2022-02-16 DIAGNOSIS — R030 Elevated blood-pressure reading, without diagnosis of hypertension: Secondary | ICD-10-CM | POA: Diagnosis not present

## 2022-02-16 LAB — LIPID PANEL
Cholesterol: 130 mg/dL (ref 0–200)
HDL: 53.1 mg/dL (ref >39.00)
LDL Cholesterol: 68 mg/dL (ref 0–99)
NonHDL: 77.21
Total CHOL/HDL Ratio: 2
Triglycerides: 47 mg/dL (ref 0.0–149.0)
VLDL: 9.4 mg/dL (ref 0.0–40.0)

## 2022-02-16 LAB — COMPREHENSIVE METABOLIC PANEL
ALT: 16 U/L (ref 0–53)
AST: 20 U/L (ref 0–37)
Albumin: 4.2 g/dL (ref 3.5–5.2)
Alkaline Phosphatase: 62 U/L (ref 39–117)
BUN: 17 mg/dL (ref 6–23)
CO2: 30 mEq/L (ref 19–32)
Calcium: 9.2 mg/dL (ref 8.4–10.5)
Chloride: 101 mEq/L (ref 96–112)
Creatinine, Ser: 0.9 mg/dL (ref 0.40–1.50)
GFR: 90.03 mL/min (ref >60.00)
Glucose, Bld: 131 mg/dL — ABNORMAL HIGH (ref 70–99)
Potassium: 4.5 mEq/L (ref 3.5–5.1)
Sodium: 138 mEq/L (ref 135–145)
Total Bilirubin: 0.5 mg/dL (ref 0.2–1.2)
Total Protein: 6.9 g/dL (ref 6.0–8.3)

## 2022-02-16 LAB — MICROALBUMIN / CREATININE URINE RATIO
Creatinine,U: 45.6 mg/dL
Microalb Creat Ratio: 1.5 mg/g (ref 0.0–30.0)
Microalb, Ur: <0.7 mg/dL (ref 0.0–1.9)

## 2022-02-16 LAB — PSA: PSA: 1.63 ng/mL (ref 0.10–4.00)

## 2022-02-16 NOTE — Patient Instructions (Signed)
Nice to see you. Please start checking your blood pressure daily when you have been relaxed for 10 to 15 minutes at home.  Please send me your readings in 2 weeks. We will contact you with your lab results.

## 2022-02-16 NOTE — Progress Notes (Signed)
Christopher Rumps, MD Phone: [phone]  Christopher Woods. is a 66 y.o. male who presents today for CPE.  Diet: low fat meats, plenty of fruits and vegetables, rarely has a sugary drink Exercise: walking, no weights with recent back surgery Colonoscopy: 02/18/18, hyperplastic polyp, 10 year recall Prostate cancer screening: due Family history-  Prostate cancer: father  Colon cancer: no Vaccines-   Flu: due  Tetanus: UTD  Shingles: due   COVID19: x4 HIV screening: UTD Hep C Screening: UTD Tobacco use: no Alcohol use: no Illicit Drug use: no Dentist: yes Ophthalmology: yes Patient does not check his BP at home.    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 06/10/2017   Enthesopathy of left hip region 07/27/2014   Radiculopathy of lumbar region 12/15/2011   Routine general medical examination at a health care facility 01/26/2018   Benign neoplasm of descending colon    Chronic left hip pain 08/02/2019   Toenail deformity 08/02/2019   Family history of prostate cancer in father 02/05/2020   Overweight 08/05/2020   Decreased pedal pulses 08/05/2020   DDD (degenerative disc disease), cervical 12/14/2017   Chronic neck pain 12/14/2017   Impingement syndrome of left shoulder region 02/24/2019   Abdominal pain 02/05/2021   Resolved Ambulatory Problems    Diagnosis Date Noted   Type 2 diabetes mellitus without complication ([place]) 09/73/5329   IFG (impaired fasting glucose) 11/01/2015   Confusion 03/19/2016   Muscle strain 06/23/2016   Left testicle enlargement 12/21/2016   Left leg pain 04/12/2017   Preop examination 05/13/2017   Acute back pain with  sciatica 06/04/2017   Pain in joint, pelvic region and thigh 08/25/2011   Tear of right acetabular labrum 09/02/2011   Thoracic back pain 01/26/2018   Encounter for screening colonoscopy    Diabetes mellitus ([place]) 03/03/2019   Past Medical History:  Diagnosis Date   Arthritis    Dupuytren's disease    High coronary artery calcium score    History of kidney stones    Lumbago    MVP (mitral valve prolapse)    Trigeminal neuralgia     Family History  Problem Relation Age of Onset   Hypertension Mother    Hypertension Father    Cancer Father        prostate   Hypertension Sister    Cancer Sister        melanoma   Kidney disease Sister     Social History   Socioeconomic History   Marital status: Married    Spouse name: Not on file   Number of children: Not on file   Years of education: Not on file   Highest education level: Not on file  Occupational History   Not on file  Tobacco Use   Smoking status: Never   Smokeless tobacco: Never  Vaping Use   Vaping Use: Never used  Substance and Sexual Activity   Alcohol use: No   Drug use: No   Sexual activity: Not on file  Other Topics Concern   Not on file  Social History Narrative   Not on file   Social Determinants of Health   Financial Resource Strain: Not on  file  Food Insecurity: Not on file  Transportation Needs: Not on file  Physical Activity: Not on file  Stress: Not on file  Social Connections: Not on file  Intimate Partner Violence: Not on file    ROS  General:  Negative for nexplained weight loss, fever Skin: Negative for new or changing mole, sore that won't heal HEENT: Negative for trouble hearing, trouble seeing, ringing in ears, mouth sores, hoarseness, change in voice, dysphagia. CV:  Negative for chest pain, dyspnea, edema, palpitations Resp: Negative for cough, dyspnea, hemoptysis GI: Negative for nausea, vomiting, diarrhea, constipation, abdominal pain, melena, hematochezia. GU: Negative  for dysuria, incontinence, urinary hesitance, hematuria, vaginal or penile discharge, polyuria, sexual difficulty, lumps in testicle or breasts MSK: Negative for muscle cramps or aches, joint pain or swelling Neuro: Negative for headaches, weakness, numbness, dizziness, passing out/fainting Psych: Negative for depression, anxiety, memory problems  Objective  Physical Exam Vitals:   02/16/22 0849 02/16/22 0856  BP: (!) 152/88 130/80  Pulse: 65   Temp: 97.8 F (36.6 C)   SpO2: 99%     BP Readings from Last 3 Encounters:  02/16/22 130/80  10/13/21 (!) 152/80  09/15/21 130/78   Wt Readings from Last 3 Encounters:  02/16/22 220 lb 12.8 oz (100.2 kg)  10/13/21 216 lb 6 oz (98.1 kg)  09/15/21 218 lb 3.2 oz (99 kg)    Physical Exam Constitutional:      General: He is not in acute distress.    Appearance: He is not diaphoretic.  HENT:     Head: Normocephalic and atraumatic.  Eyes:     Conjunctiva/sclera: Conjunctivae normal.     Pupils: Pupils are equal, round, and reactive to light.  Cardiovascular:     Rate and Rhythm: Normal rate and regular rhythm.     Heart sounds: Normal heart sounds.  Pulmonary:     Effort: Pulmonary effort is normal.     Breath sounds: Normal breath sounds.  Abdominal:     General: Bowel sounds are normal. There is no distension.     Palpations: Abdomen is soft.     Tenderness: There is no abdominal tenderness.  Musculoskeletal:     Right lower leg: No edema.     Left lower leg: No edema.  Lymphadenopathy:     Cervical: No cervical adenopathy.  Skin:    General: Skin is warm and dry.  Neurological:     Mental Status: He is alert.  Psychiatric:        Mood and Affect: Mood normal.      Assessment/Plan:   Problem List Items Addressed This Visit     Diabetes mellitus type 2 in nonobese ([place])   Relevant Orders   HgB A1c   Urine Microalbumin w/creat. ratio   Elevated BP without diagnosis of hypertension    Improved on recheck.   Discussed checking daily at home when he is relaxed for 10 to 15 minutes.  He will send me his readings in 2 weeks.      Hyperlipidemia associated with type 2 diabetes mellitus ([place])   Relevant Orders   Comp Met (CMET)   Lipid panel   Routine general medical examination at a health care facility - Primary    Physical exam completed.  He will continue healthy diet.  He will try to remain as active as he is able to given his prior back surgery.  He will start checking his blood pressure at home.  Prostate cancer screening with lab work.  Discussed getting the Shingrix vaccine and he will check with his HR person at work on coverage for this.  I did discuss that once he goes on Medicare if he has a supplement that should be covered.  I encouraged him to get the updated COVID-vaccine.  Lab work as outlined.      Other Visit Diagnoses     Need for immunization against influenza       Relevant Orders   Flu Vaccine QUAD 15mo+IM (Fluarix, Fluzone & Alfiuria Quad PF) (Completed)   Prostate cancer screening       Relevant Orders   PSA       Return in about 6 months (around 08/17/2022) for DM, BP f/u.   Christopher Rumps, MD [place]

## 2022-02-16 NOTE — Assessment & Plan Note (Signed)
Improved on recheck.  Discussed checking daily at home when he is relaxed for 10 to 15 minutes.  He will send me his readings in 2 weeks.

## 2022-02-16 NOTE — Assessment & Plan Note (Signed)
Physical exam completed.  He will continue healthy diet.  He will try to remain as active as he is able to given his prior back surgery.  He will start checking his blood pressure at home.  Prostate cancer screening with lab work.  Discussed getting the Shingrix vaccine and he will check with his HR person at work on coverage for this.  I did discuss that once he goes on Medicare if he has a supplement that should be covered.  I encouraged him to get the updated COVID-vaccine.  Lab work as outlined.

## 2022-02-17 LAB — HEMOGLOBIN A1C: Hgb A1c MFr Bld: 6.7 % — ABNORMAL HIGH (ref 4.6–6.5)

## 2022-03-13 LAB — HM DIABETES EYE EXAM

## 2022-06-19 ENCOUNTER — Encounter: Payer: Self-pay | Admitting: Family Medicine

## 2022-06-19 ENCOUNTER — Telehealth (INDEPENDENT_AMBULATORY_CARE_PROVIDER_SITE_OTHER): Payer: Medicare Other | Admitting: Family Medicine

## 2022-06-19 DIAGNOSIS — J0100 Acute maxillary sinusitis, unspecified: Secondary | ICD-10-CM | POA: Diagnosis not present

## 2022-06-19 MED ORDER — FLUTICASONE PROPIONATE 50 MCG/ACT NA SUSP: 2.0000 | Freq: Every day | NASAL | 6 refills | Status: DC

## 2022-06-19 MED ORDER — AMOXICILLIN-POT CLAVULANATE 875-125 MG PO TABS: 1.0000 | ORAL_TABLET | Freq: Two times a day (BID) | ORAL | 0 refills | Status: DC

## 2022-06-19 NOTE — Assessment & Plan Note (Signed)
Patient symptoms could be related to sinusitis or allergies.  Will treat with Augmentin 1 pill twice daily for 7 days and have him start on Flonase.  Discussed contacting us if he develops diarrhea from the antibiotic.  If not improving over the next week he will let us know.

## 2022-06-19 NOTE — Progress Notes (Signed)
Virtual Visit via video Note  This visit type was conducted due to national recommendations for restrictions regarding the COVID-19 pandemic (e.g. social distancing).  This format is felt to be most appropriate for this patient at this time.  All issues noted in this document were discussed and addressed.  No physical exam was performed (except for noted visual exam findings with Video Visits).   I connected with Christopher Woods today at  4:30 PM EST by a video enabled telemedicine application or telephone and verified that I am speaking with the correct person using two identifiers. Location patient: home Location provider: work Persons participating in the virtual visit: patient, provider  I discussed the limitations, risks, security and privacy concerns of performing an evaluation and management service by telephone and the availability of in person appointments. I also discussed with the patient that there may be a patient responsible charge related to this service. The patient expressed understanding and agreed to proceed.   Reason for visit: same day visit  HPI: Congestion: Patient notes ongoing symptoms since November.  He notes some cough and chest and sinus congestion.  He is blowing mostly clear mucus out of his nose.  He notes initially he got a little better though symptoms all came back after Christmas and has not been able to get rid of them.  He does note fatigue.  No fever or shortness of breath.  He does have sneezing intermittently and postnasal drip.   ROS: See pertinent positives and negatives per HPI.  Past Medical History:  Diagnosis Date   Arthritis    Dupuytren's disease    High coronary artery calcium score    a. 04/2017 Cardiac CT: Cor Ca2+ = 100 (68th%'ile).   History of kidney stones    Lumbago    MVP (mitral valve prolapse)    Trigeminal neuralgia    resolved   Type 2 diabetes mellitus without complication ([place]) 09/24/8880    Past Surgical History:   Procedure Laterality Date   COLONOSCOPY WITH PROPOFOL N/A 02/18/2018   Procedure: COLONOSCOPY WITH PROPOFOL WITH BIOPSY;  Surgeon: Lucilla Lame, MD;  Location: [facility];  Service: Endoscopy;  Laterality: N/A;  Diabetic- requests to be first   HAND SURGERY Right    thumb reconstruction   HIP SURGERY Left    HIP SURGERY Right    KNEE SURGERY Left    KNEE SURGERY Right    meniscal tear   LUMBAR LAMINECTOMY/DECOMPRESSION MICRODISCECTOMY Left 06/10/2017   Procedure: L4-5 disectomy left far lateral;  Surgeon: Melina Schools, MD;  Location: [facility];  Service: Orthopedics;  Laterality: Left;  120 mins   POLYPECTOMY N/A 02/18/2018   Procedure: POLYPECTOMY;  Surgeon: Lucilla Lame, MD;  Location: [facility];  Service: Endoscopy;  Laterality: N/A;   TRIGGER FINGER RELEASE Left     Family History  Problem Relation Age of Onset   Hypertension Mother    Hypertension Father    Cancer Father        prostate   Hypertension Sister    Cancer Sister        melanoma   Kidney disease Sister     SOCIAL HX: Non-smoker.   Current Outpatient Medications:    amoxicillin-clavulanate (AUGMENTIN) 875-125 MG tablet, Take 1 tablet by mouth 2 (two) times daily., Disp: 14 tablet, Rfl: 0   Ascorbic Acid (VITAMIN C) 500 MG CHEW, Chew 1 tablet by mouth daily., Disp: , Rfl:    blood glucose meter kit and supplies KIT,  Dispense freestyle light. Use two times daily as directed. (FOR ICD-9 250.00, 250.01)., Disp: 1 each, Rfl: 0   ezetimibe (ZETIA) 10 MG tablet, TAKE 1 TABLET BY MOUTH EVERY DAY, Disp: 90 tablet, Rfl: 1   fluticasone (FLONASE) 50 MCG/ACT nasal spray, Place 2 sprays into both nostrils daily., Disp: 16 g, Rfl: 6   gabapentin (NEURONTIN) 300 MG capsule, TAKE 1 CAPSULE BY MOUTH TWICE A DAY, Disp: 60 capsule, Rfl: 1   metFORMIN (GLUCOPHAGE) 500 MG tablet, TAKE 1 TABLET BY MOUTH EVERYDAY AT BEDTIME, Disp: 90 tablet, Rfl: 1   Multiple Vitamin (MULTIVITAMIN) tablet, Take 1 tablet by mouth  daily., Disp: , Rfl:    rosuvastatin (CRESTOR) 10 MG tablet, TAKE 1 TABLET BY MOUTH EVERY DAY, Disp: 90 tablet, Rfl: 1   Zinc 50 MG TABS, Take 1 tablet by mouth daily., Disp: , Rfl:   EXAM:  VITALS per patient if applicable:  GENERAL: alert, oriented, appears well and in no acute distress  HEENT: atraumatic, conjunttiva clear, no obvious abnormalities on inspection of external nose and ears  NECK: normal movements of the head and neck  LUNGS: on inspection no signs of respiratory distress, breathing rate appears normal, no obvious gross SOB, gasping or wheezing  CV: no obvious cyanosis  MS: moves all visible extremities without noticeable abnormality  PSYCH/NEURO: pleasant and cooperative, no obvious depression or anxiety, speech and thought processing grossly intact  ASSESSMENT AND PLAN:  Discussed the following assessment and plan:  Problem List Items Addressed This Visit     Acute non-recurrent maxillary sinusitis - Primary    Patient symptoms could be related to sinusitis or allergies.  Will treat with Augmentin 1 pill twice daily for 7 days and have him start on Flonase.  Discussed contacting us if he develops diarrhea from the antibiotic.  If not improving over the next week he will let us know.      Relevant Medications   amoxicillin-clavulanate (AUGMENTIN) 875-125 MG tablet   fluticasone (FLONASE) 50 MCG/ACT nasal spray    Return if symptoms worsen or fail to improve.   I discussed the assessment and treatment plan with the patient. The patient was provided an opportunity to ask questions and all were answered. The patient agreed with the plan and demonstrated an understanding of the instructions.   The patient was advised to call back or seek an in-person evaluation if the symptoms worsen or if the condition fails to improve as anticipated.   [name], MD

## 2022-07-30 ENCOUNTER — Other Ambulatory Visit: Payer: Self-pay | Admitting: Family

## 2022-07-30 ENCOUNTER — Other Ambulatory Visit: Payer: Self-pay

## 2022-07-30 ENCOUNTER — Other Ambulatory Visit: Payer: Self-pay | Admitting: Family Medicine

## 2022-08-17 ENCOUNTER — Ambulatory Visit (INDEPENDENT_AMBULATORY_CARE_PROVIDER_SITE_OTHER): Payer: Medicare Other | Admitting: Family Medicine

## 2022-08-17 ENCOUNTER — Encounter: Payer: Self-pay | Admitting: Family Medicine

## 2022-08-17 VITALS — BP 134/82 | HR 74 | Temp 98.4°F | Ht 73.0 in | Wt 220.0 lb

## 2022-08-17 DIAGNOSIS — R030 Elevated blood-pressure reading, without diagnosis of hypertension: Secondary | ICD-10-CM

## 2022-08-17 DIAGNOSIS — E1169 Type 2 diabetes mellitus with other specified complication: Secondary | ICD-10-CM

## 2022-08-17 DIAGNOSIS — E119 Type 2 diabetes mellitus without complications: Secondary | ICD-10-CM

## 2022-08-17 DIAGNOSIS — R002 Palpitations: Secondary | ICD-10-CM | POA: Diagnosis not present

## 2022-08-17 DIAGNOSIS — F419 Anxiety disorder, unspecified: Secondary | ICD-10-CM | POA: Insufficient documentation

## 2022-08-17 DIAGNOSIS — E785 Hyperlipidemia, unspecified: Secondary | ICD-10-CM

## 2022-08-17 LAB — POCT GLYCOSYLATED HEMOGLOBIN (HGB A1C): Hemoglobin A1C: 6.1 % — AB (ref 4.0–5.6)

## 2022-08-17 MED ORDER — EZETIMIBE 10 MG PO TABS: 10.0000 mg | ORAL_TABLET | Freq: Every day | ORAL | 3 refills | Status: DC

## 2022-08-17 MED ORDER — METFORMIN HCL 500 MG PO TABS
500.0000 mg | ORAL_TABLET | Freq: Every day | ORAL | 3 refills | Status: DC
Start: 2022-08-17 — End: 2023-08-09

## 2022-08-17 MED ORDER — ROSUVASTATIN CALCIUM 10 MG PO TABS: 10.0000 mg | ORAL_TABLET | Freq: Every day | ORAL | 3 refills | Status: DC

## 2022-08-17 NOTE — Assessment & Plan Note (Addendum)
Rare symptoms.  Possibly related to mild anxiety.  He has seen cardiology in the past and his most recent EKG was reassuring.  Given the infrequency of his symptoms I do not think a monitor would be very useful.  Discussed monitoring if they occur more frequently we could have him follow back up with cardiology.  I discussed it is possible that this could be from [place] though it could be from some other underlying causes or just from the anxiety.

## 2022-08-17 NOTE — Assessment & Plan Note (Signed)
Chronic issue.  Adequately controlled on most recent check.  Patient will continue Zetia 10 mg daily and Crestor 10 mg daily.

## 2022-08-17 NOTE — Assessment & Plan Note (Signed)
Chronic issue.  Check A1c.  Continue metformin 500 mg daily.

## 2022-08-17 NOTE — Progress Notes (Signed)
Christopher Rumps, MD Phone: [phone]  Christopher Mayhew Decameron Hood. is a 67 y.o. male who presents today for f/u.  HYPERLIPIDEMIA Symptoms Chest pain on exertion:  no   Leg claudication:   no Medications: Compliance- taking zetia, crestor Right upper quadrant pain- no  Muscle aches- no Lipid Panel     Component Value Date/Time   CHOL 130 02/16/2022 0902   CHOL 221 (H) 02/26/2016 0842   TRIG 47.0 02/16/2022 0902   HDL 53.10 02/16/2022 0902   HDL 55 02/26/2016 0842   CHOLHDL 2 02/16/2022 0902   VLDL 9.4 02/16/2022 0902   LDLCALC 68 02/16/2022 0902   LDLCALC 155 (H) 02/26/2016 0842   LDLDIRECT 58.0 08/02/2019 0914   LABVLDL 11 02/26/2016 0842   DIABETES Disease Monitoring: Blood Sugar ranges-not checking Polyuria/phagia/dipsia- no      Optho- UTD Medications: Compliance- taking metformin Hypoglycemic symptoms- no  Elevated blood pressure: Blood pressure at home has been running in the 120s over 60s-70s.  He notes no chest pain or shortness of breath.  Palpitations: Patient notes a history of MVP as a child.  He was getting flutters in his chest back then though that was attributed to caffeine.  He notes he has discussed intermittent issues with fluttering sensation in his chest with his cardiologist.  He notes cardiology advised his exam and EKGs were okay.  EKG in May 2023 revealed sinus rhythm.  He notes the fluttering only occurs if he gets a little anxious.  He notes it does not happen very frequently.  He wonders if this could have been related to having had COVID multiple times.   Social History   Tobacco Use  Smoking Status Never  Smokeless Tobacco Never    Current Outpatient Medications on File Prior to Visit  Medication Sig Dispense Refill   Ascorbic Acid (VITAMIN C) 500 MG CHEW Chew 1 tablet by mouth daily.     blood glucose meter kit and supplies KIT Dispense freestyle light. Use two times daily as directed. (FOR ICD-9 250.00, 250.01). 1 each 0   fluticasone  (FLONASE) 50 MCG/ACT nasal spray Place 2 sprays into both nostrils daily. 16 g 6   gabapentin (NEURONTIN) 300 MG capsule TAKE 1 CAPSULE BY MOUTH TWICE A DAY 60 capsule 1   Multiple Vitamin (MULTIVITAMIN) tablet Take 1 tablet by mouth daily.     No current facility-administered medications on file prior to visit.     ROS see history of present illness  Objective  Physical Exam Vitals:   08/17/22 0809 08/17/22 0824  BP: 136/84 134/82  Pulse: 74   Temp: 98.4 F (36.9 C)   SpO2: 98%     BP Readings from Last 3 Encounters:  08/17/22 134/82  02/16/22 130/80  10/13/21 (!) 152/80   Wt Readings from Last 3 Encounters:  08/17/22 220 lb (99.8 kg)  02/16/22 220 lb 12.8 oz (100.2 kg)  10/13/21 216 lb 6 oz (98.1 kg)    Physical Exam Constitutional:      General: He is not in acute distress.    Appearance: He is not diaphoretic.  Cardiovascular:     Rate and Rhythm: Normal rate and regular rhythm.     Heart sounds: Normal heart sounds.  Pulmonary:     Effort: Pulmonary effort is normal.     Breath sounds: Normal breath sounds.  Skin:    General: Skin is warm and dry.  Neurological:     Mental Status: He is alert.  Assessment/Plan: Please see individual problem list.  Diabetes mellitus type 2 in nonobese [facility]) Assessment & Plan: Chronic issue.  Check A1c.  Continue metformin 500 mg daily.  Orders: -     metFORMIN HCl; Take 1 tablet (500 mg total) by mouth at bedtime.  Dispense: 90 tablet; Refill: 3 -     POCT glycosylated hemoglobin (Hb A1C)  Elevated BP without diagnosis of hypertension Assessment & Plan: Blood pressure at home is adequately controlled.  He will continue to periodically monitor.  Goal blood pressure is less than 130/80.   Hyperlipidemia associated with type 2 diabetes mellitus ([place]) Assessment & Plan: Chronic issue.  Adequately controlled on most recent check.  Patient will continue Zetia 10 mg daily and Crestor 10 mg daily.  Orders: -      Ezetimibe; Take 1 tablet (10 mg total) by mouth daily.  Dispense: 90 tablet; Refill: 3 -     Rosuvastatin Calcium; Take 1 tablet (10 mg total) by mouth daily.  Dispense: 90 tablet; Refill: 3  Palpitations Assessment & Plan: Rare symptoms.  Possibly related to mild anxiety.  He has seen cardiology in the past and his most recent EKG was reassuring.  Given the infrequency of his symptoms I do not think a monitor would be very useful.  Discussed monitoring if they occur more frequently we could have him follow back up with cardiology.  I discussed it is possible that this could be from [place] though it could be from some other underlying causes or just from the anxiety.   Anxiety Assessment & Plan: Very mild.  Patient will monitor.      Health Maintenance: I encouraged the patient to get the updated COVID vaccination.  Return in about 6 months (around 02/17/2023) for physical.   Christopher Rumps, MD [place]

## 2022-08-17 NOTE — Assessment & Plan Note (Signed)
Very mild.  Patient will monitor.

## 2022-08-17 NOTE — Assessment & Plan Note (Signed)
Blood pressure at home is adequately controlled.  He will continue to periodically monitor.  Goal blood pressure is less than 130/80.

## 2022-08-17 NOTE — Patient Instructions (Signed)
Nice to see you. Please continue to keep a periodic check on your blood pressure.  Your goal blood pressure is less than 130/80.

## 2022-12-02 IMAGING — DX DG CHEST 2V
2 series · 2 of 2 positions shown · non-contrast
Comparison: 10/12/2014

CLINICAL DATA: And check how and taken are and chest pain and
fever.

EXAM:
CHEST - 2 VIEW

[chest pa]
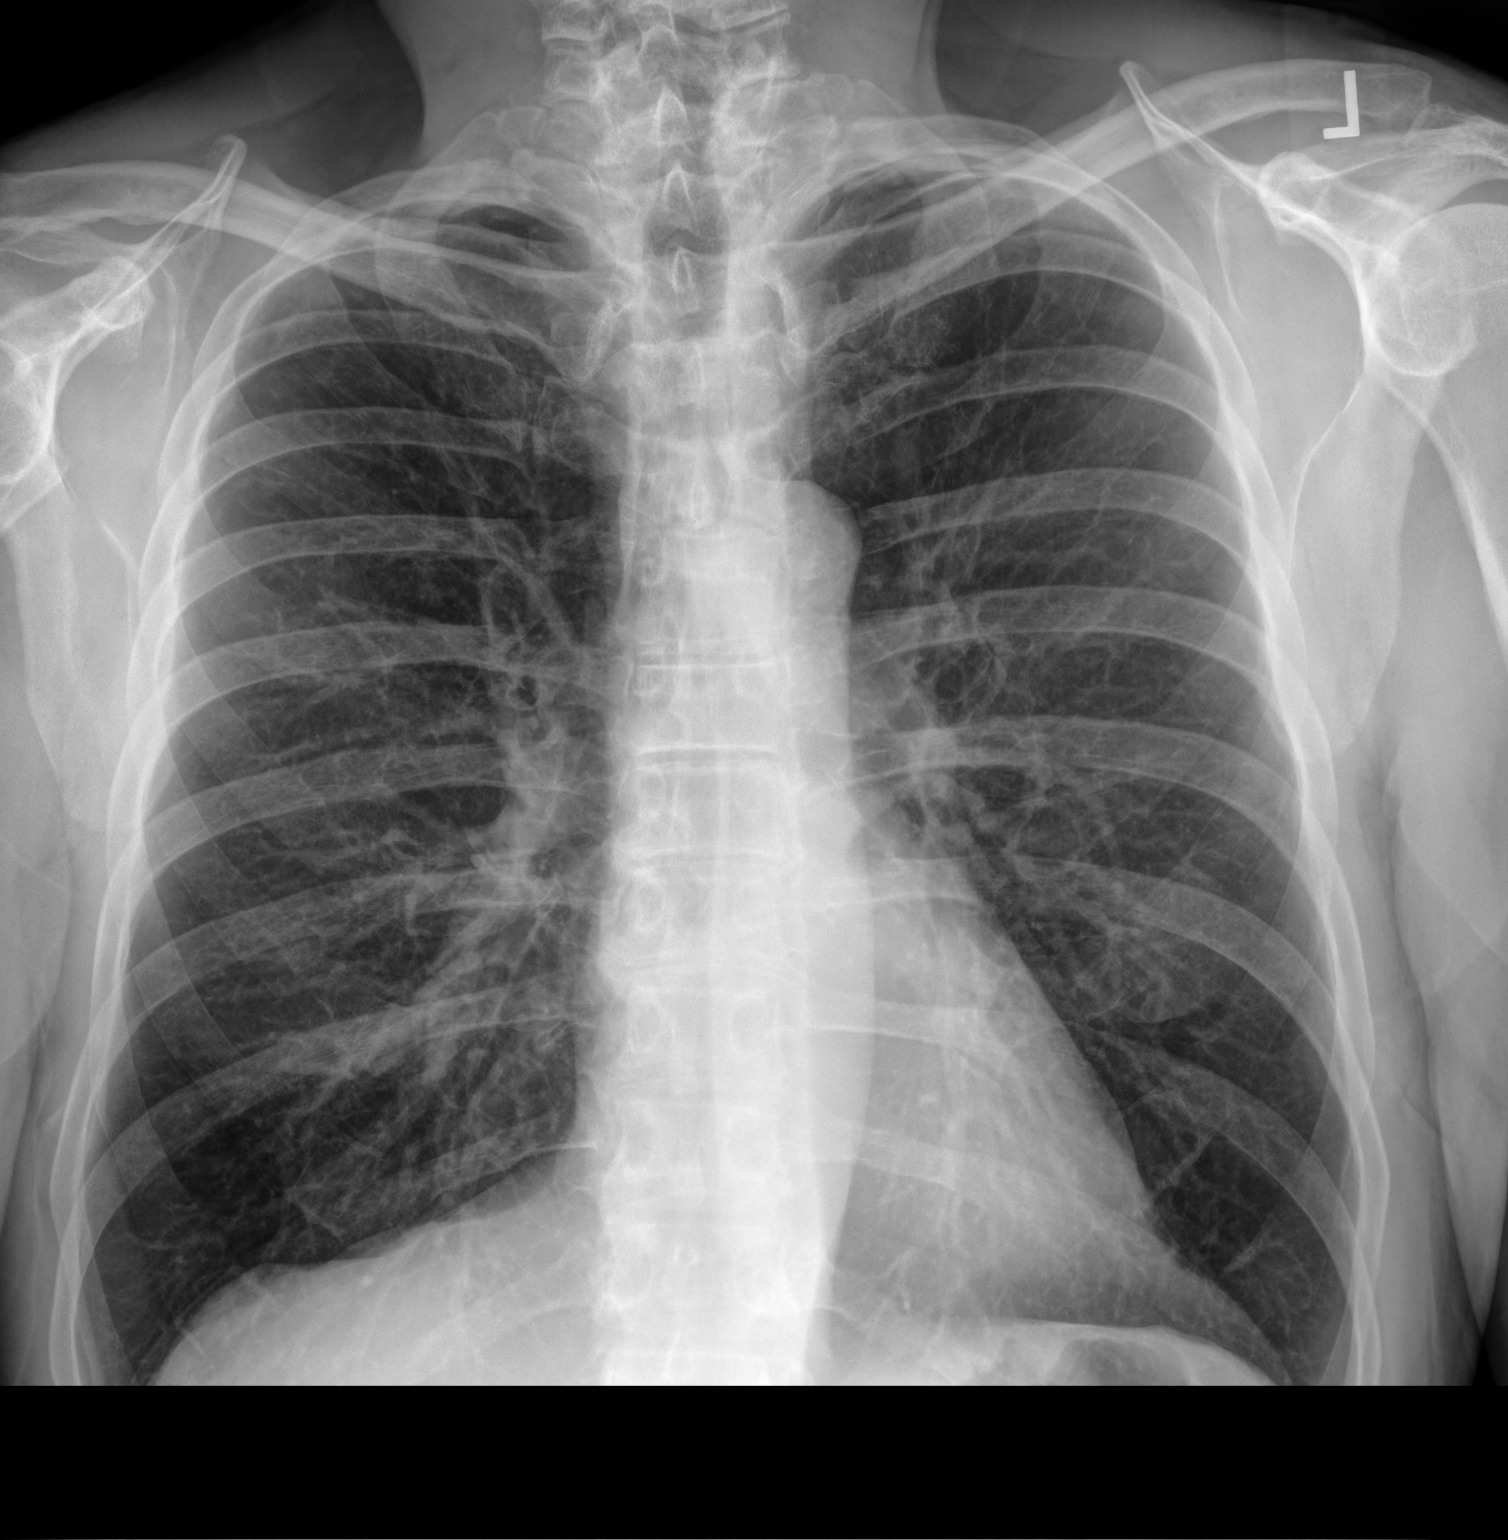

[chest lat]
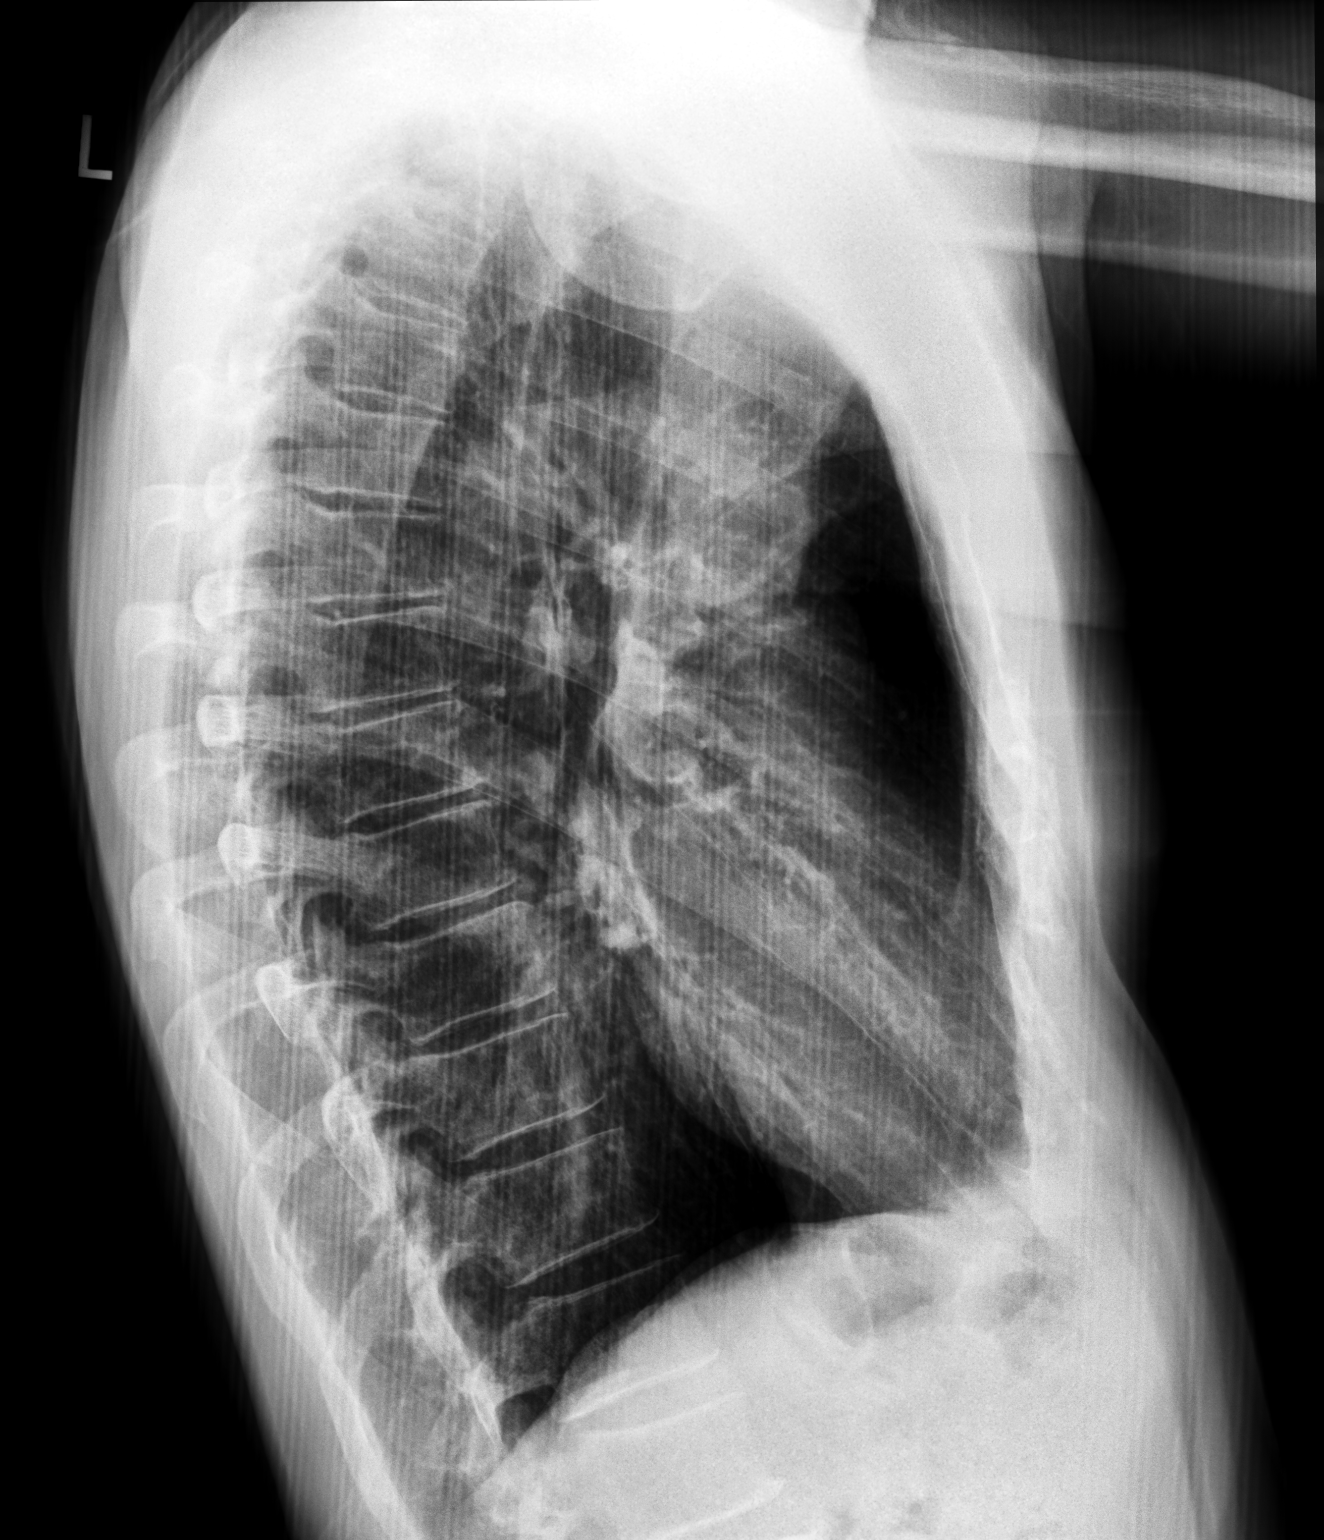

[2 of 2 positions shown; findings below may reference images not displayed]

FINDINGS: The cardiac silhouette, mediastinal and hilar contours are within
normal limits.

The lungs demonstrate moderate hyperinflation which appears stable.
No infiltrates, edema or effusions. The bony thorax is intact.
IMPRESSION: Stable hyperinflation but no acute pulmonary findings.

## 2023-01-07 ENCOUNTER — Encounter (INDEPENDENT_AMBULATORY_CARE_PROVIDER_SITE_OTHER): Payer: Self-pay

## 2023-02-19 ENCOUNTER — Encounter: Payer: Self-pay | Admitting: Family Medicine

## 2023-02-19 ENCOUNTER — Ambulatory Visit (INDEPENDENT_AMBULATORY_CARE_PROVIDER_SITE_OTHER): Payer: Medicare Other | Admitting: Family Medicine

## 2023-02-19 VITALS — BP 138/88 | HR 83 | Temp 98.2°F | Ht 73.0 in | Wt 216.8 lb

## 2023-02-19 DIAGNOSIS — E119 Type 2 diabetes mellitus without complications: Secondary | ICD-10-CM

## 2023-02-19 DIAGNOSIS — R0989 Other specified symptoms and signs involving the circulatory and respiratory systems: Secondary | ICD-10-CM

## 2023-02-19 DIAGNOSIS — E785 Hyperlipidemia, unspecified: Secondary | ICD-10-CM

## 2023-02-19 DIAGNOSIS — E1169 Type 2 diabetes mellitus with other specified complication: Secondary | ICD-10-CM

## 2023-02-19 DIAGNOSIS — Z7984 Long term (current) use of oral hypoglycemic drugs: Secondary | ICD-10-CM | POA: Diagnosis not present

## 2023-02-19 DIAGNOSIS — N644 Mastodynia: Secondary | ICD-10-CM | POA: Diagnosis not present

## 2023-02-19 DIAGNOSIS — Z125 Encounter for screening for malignant neoplasm of prostate: Secondary | ICD-10-CM

## 2023-02-19 LAB — COMPREHENSIVE METABOLIC PANEL
ALT: 15 U/L (ref 0–53)
AST: 22 U/L (ref 0–37)
Albumin: 4.2 g/dL (ref 3.5–5.2)
Alkaline Phosphatase: 62 U/L (ref 39–117)
BUN: 16 mg/dL (ref 6–23)
CO2: 30 meq/L (ref 19–32)
Calcium: 9.5 mg/dL (ref 8.4–10.5)
Chloride: 103 meq/L (ref 96–112)
Creatinine, Ser: 0.97 mg/dL (ref 0.40–1.50)
GFR: 81.71 mL/min (ref >60.00)
Glucose, Bld: 128 mg/dL — ABNORMAL HIGH (ref 70–99)
Potassium: 4.5 meq/L (ref 3.5–5.1)
Sodium: 140 meq/L (ref 135–145)
Total Bilirubin: 0.7 mg/dL (ref 0.2–1.2)
Total Protein: 6.8 g/dL (ref 6.0–8.3)

## 2023-02-19 LAB — LIPID PANEL
Cholesterol: 134 mg/dL (ref 0–200)
HDL: 55.6 mg/dL (ref >39.00)
LDL Cholesterol: 67 mg/dL (ref 0–99)
NonHDL: 78.68
Total CHOL/HDL Ratio: 2
Triglycerides: 58 mg/dL (ref 0.0–149.0)
VLDL: 11.6 mg/dL (ref 0.0–40.0)

## 2023-02-19 LAB — MICROALBUMIN / CREATININE URINE RATIO
Creatinine,U: 123.7 mg/dL
Microalb Creat Ratio: 0.6 mg/g (ref 0.0–30.0)
Microalb, Ur: <0.7 mg/dL (ref 0.0–1.9)

## 2023-02-19 LAB — PSA: PSA: 2.02 ng/mL (ref 0.10–4.00)

## 2023-02-19 LAB — HEMOGLOBIN A1C: Hgb A1c MFr Bld: 6.6 % — ABNORMAL HIGH (ref 4.6–6.5)

## 2023-02-19 NOTE — Assessment & Plan Note (Signed)
No obvious abnormalities on exam.  Will get a mammogram to ensure no underlying abnormalities.  Discussed potential for lab workup depending on mammogram results.

## 2023-02-19 NOTE — Progress Notes (Signed)
Marikay Alar, MD Phone: [phone]  Christopher Woods. is a 67 y.o. male who presents today for f/u.  DIABETES Disease Monitoring: Blood Sugar ranges-not checking  Polyuria/phagia/dipsia- no      Optho- UTD Medications: Compliance- taking metformin Hypoglycemic symptoms- no  HYPERLIPIDEMIA Symptoms Chest pain on exertion:  no   Leg claudication:   no Medications: Compliance- taking zetia, crestor Right upper quadrant pain- no  Muscle aches- no Lipid Panel     Component Value Date/Time   CHOL 130 02/16/2022 0902   CHOL 221 (H) 02/26/2016 0842   TRIG 47.0 02/16/2022 0902   HDL 53.10 02/16/2022 0902   HDL 55 02/26/2016 0842   CHOLHDL 2 02/16/2022 0902   VLDL 9.4 02/16/2022 0902   LDLCALC 68 02/16/2022 0902   LDLCALC 155 (H) 02/26/2016 0842   LDLDIRECT 58.0 08/02/2019 0914   LABVLDL 11 02/26/2016 0842   Patient reports blood pressure is adequately controlled at home.  Typically 120s over 70s though he does not check frequently.  Chest enlargement: Patient reports bilateral chest area seems to be larger than it has been in the past.  He is had some tenderness at times as well.  He notes this does not seem to be muscular tenderness.  He has gained some weight per his report.  Social History   Tobacco Use  Smoking Status Never  Smokeless Tobacco Never    Current Outpatient Medications on File Prior to Visit  Medication Sig Dispense Refill   Ascorbic Acid (VITAMIN C) 500 MG CHEW Chew 1 tablet by mouth daily.     blood glucose meter kit and supplies KIT Dispense freestyle light. Use two times daily as directed. (FOR ICD-9 250.00, 250.01). 1 each 0   ezetimibe (ZETIA) 10 MG tablet Take 1 tablet (10 mg total) by mouth daily. 90 tablet 3   metFORMIN (GLUCOPHAGE) 500 MG tablet Take 1 tablet (500 mg total) by mouth at bedtime. 90 tablet 3   Multiple Vitamin (MULTIVITAMIN) tablet Take 1 tablet by mouth daily.     rosuvastatin (CRESTOR) 10 MG tablet Take 1 tablet (10 mg  total) by mouth daily. 90 tablet 3   No current facility-administered medications on file prior to visit.     ROS see history of present illness  Objective  Physical Exam Vitals:   02/19/23 0806  BP: 138/88  Pulse: 83  Temp: 98.2 F (36.8 C)  SpO2: 99%    BP Readings from Last 3 Encounters:  02/19/23 138/88  08/17/22 134/82  02/16/22 130/80   Wt Readings from Last 3 Encounters:  02/19/23 216 lb 12.8 oz (98.3 kg)  08/17/22 220 lb (99.8 kg)  02/16/22 220 lb 12.8 oz (100.2 kg)    Physical Exam Constitutional:      General: He is not in acute distress.    Appearance: He is not diaphoretic.  Cardiovascular:     Rate and Rhythm: Normal rate and regular rhythm.     Heart sounds: Normal heart sounds.  Pulmonary:     Effort: Pulmonary effort is normal.     Breath sounds: Normal breath sounds.  Chest:     Comments: No obvious gynecomastia on exam, no chest tenderness Skin:    General: Skin is warm and dry.  Neurological:     Mental Status: He is alert.    Diabetic Foot Exam - Simple   Simple Foot Form Diabetic Foot exam was performed with the following findings: Yes 02/19/2023  8:24 AM  Visual Inspection No deformities,  no ulcerations, no other skin breakdown bilaterally: Yes Sensation Testing Intact to touch and monofilament testing bilaterally: Yes Pulse Check See comments: Yes Comments Difficult to palpate pedal pulses bilaterally      Assessment/Plan: Please see individual problem list.  Diabetes mellitus type 2 in nonobese [facility]) Assessment & Plan: Chronic issue.  Check A1c.  Continue metformin 500 mg daily.  Orders: -     Comprehensive metabolic panel -     Lipid panel -     Hemoglobin A1c -     Microalbumin / creatinine urine ratio  Hyperlipidemia associated with type 2 diabetes mellitus (HCC) Assessment & Plan: Chronic issue.  Check lipid panel.  Continue Zetia 10 mg daily and Crestor 10 mg daily.  Orders: -     Comprehensive metabolic  panel -     Lipid panel  Breast tenderness in male Assessment & Plan: No obvious abnormalities on exam.  Will get a mammogram to ensure no underlying abnormalities.  Discussed potential for lab workup depending on mammogram results.  Orders: -     MM 3D DIAGNOSTIC MAMMOGRAM BILATERAL BREAST; Future -     [place] LIMITED ULTRASOUND INCLUDING AXILLA RIGHT BREAST; Future -     [place] LIMITED ULTRASOUND INCLUDING AXILLA LEFT BREAST ; Future  Prostate cancer screening -     PSA  Decreased pedal pulses -     US ARTERIAL ABI (SCREENING LOWER EXTREMITY); Future     Return in about 6 months (around 08/19/2023).   Marikay Alar, MD [facility]

## 2023-02-19 NOTE — Assessment & Plan Note (Signed)
Chronic issue.  Check A1c.  Continue metformin 500 mg daily.

## 2023-02-19 NOTE — Assessment & Plan Note (Signed)
Chronic issue.  Check lipid panel.  Continue Zetia 10 mg daily and Crestor 10 mg daily.

## 2023-03-05 ENCOUNTER — Ambulatory Visit
Admission: RE | Admit: 2023-03-05 | Discharge: 2023-03-05 | Disposition: A | Discharge status: Home / Self Care | Payer: Medicare Other | Source: Ambulatory Visit | Attending: Family Medicine | Admitting: Family Medicine

## 2023-03-05 ENCOUNTER — Telehealth: Payer: Self-pay

## 2023-03-05 DIAGNOSIS — N644 Mastodynia: Secondary | ICD-10-CM | POA: Insufficient documentation

## 2023-03-05 DIAGNOSIS — R92313 Mammographic fatty tissue density, bilateral breasts: Secondary | ICD-10-CM | POA: Diagnosis not present

## 2023-03-05 DIAGNOSIS — R0989 Other specified symptoms and signs involving the circulatory and respiratory systems: Secondary | ICD-10-CM | POA: Diagnosis not present

## 2023-03-05 NOTE — Telephone Encounter (Signed)
Left message to call the office back regarding the results below.

## 2023-03-05 NOTE — Telephone Encounter (Signed)
-----   Message from Marikay Alar sent at 03/05/2023 12:34 PM EDT ----- Please let the patient know that his mammogram was reassuring.  I suspect his chest enlargement may be related to regular fatty tissue.  He should monitor at this time.

## 2023-03-17 DIAGNOSIS — E119 Type 2 diabetes mellitus without complications: Secondary | ICD-10-CM | POA: Diagnosis not present

## 2023-03-17 DIAGNOSIS — H25013 Cortical age-related cataract, bilateral: Secondary | ICD-10-CM | POA: Diagnosis not present

## 2023-03-17 LAB — HM DIABETES EYE EXAM

## 2023-03-24 ENCOUNTER — Encounter: Payer: Self-pay | Admitting: Family Medicine

## 2023-05-09 ENCOUNTER — Ambulatory Visit
Admission: RE | Admit: 2023-05-09 | Discharge: 2023-05-09 | Disposition: A | Discharge status: Home / Self Care | Payer: Medicare Other | Source: Ambulatory Visit | Attending: Emergency Medicine | Admitting: Emergency Medicine

## 2023-05-09 VITALS — BP 136/71 | HR 86 | Temp 98.7°F | Resp 18 | Ht 72.0 in | Wt 210.0 lb

## 2023-05-09 DIAGNOSIS — R051 Acute cough: Secondary | ICD-10-CM

## 2023-05-09 DIAGNOSIS — J0100 Acute maxillary sinusitis, unspecified: Secondary | ICD-10-CM | POA: Diagnosis not present

## 2023-05-09 MED ORDER — AZITHROMYCIN 250 MG PO TABS: 250.0000 mg | ORAL_TABLET | Freq: Every day | ORAL | 0 refills | Status: DC

## 2023-05-09 NOTE — Discharge Instructions (Addendum)
Take the Zithromax as directed.  Follow up with your primary care provider if your symptoms are not improving.    

## 2023-05-09 NOTE — ED Triage Notes (Signed)
Patient states 1 week of cough/congestion with green phlegm.  Chills at home but hasn't checked temp.  Taking Dayquil/Nyquil with little relief

## 2023-05-09 NOTE — ED Provider Notes (Signed)
Christopher Woods    CSN: [id number] Arrival date & time: 05/09/23  1240      History   Chief Complaint Chief Complaint  Patient presents with   Cough    HPI Christopher Woods. is a 67 y.o. male.  Patient presents with 1 week history of congestion and cough.  He states he has felt warm and had chills but has not checked his temperature.  No chest pain or shortness of breath.  Treatment attempted with DayQuil and NyQuil.  His medical history includes diabetes.  The history is provided by the patient and medical records.    Past Medical History:  Diagnosis Date   Arthritis    Dupuytren's disease    High coronary artery calcium score    a. 04/2017 Cardiac CT: Cor Ca2+ = 100 (68th%'ile).   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 06/10/2017   Trigeminal neuralgia    resolved   Type 2 diabetes mellitus without complication (HCC) 02/20/2015    Patient Active Problem List   Diagnosis Date Noted   Breast tenderness in male 02/19/2023   Palpitations 08/17/2022   Anxiety 08/17/2022   Acute non-recurrent maxillary sinusitis 06/19/2022   Overweight 08/05/2020   Decreased pedal pulses 08/05/2020   Family history of prostate cancer in father 02/05/2020   Impingement syndrome of left shoulder region 02/24/2019   Benign neoplasm of descending colon    DDD (degenerative disc disease), cervical 12/14/2017   Chronic neck pain 12/14/2017   Varicose veins of both lower extremities 12/21/2016   Elevated BP without diagnosis of hypertension 12/21/2016   Hyperlipidemia associated with type 2 diabetes mellitus (HCC) 03/02/2016   Diabetes mellitus type 2 in nonobese (HCC) 02/26/2016   Radiculopathy of lumbar region 12/15/2011    Past Surgical History:  Procedure Laterality Date   COLONOSCOPY WITH PROPOFOL N/A 02/18/2018   Procedure: COLONOSCOPY WITH PROPOFOL WITH BIOPSY;  Surgeon:  Midge Minium, MD;  Location: [facility];  Service: Endoscopy;  Laterality: N/A;  Diabetic- requests to be first   HAND SURGERY Right    thumb reconstruction   HIP SURGERY Left    HIP SURGERY Right    KNEE SURGERY Left    KNEE SURGERY Right    meniscal tear   LUMBAR LAMINECTOMY/DECOMPRESSION MICRODISCECTOMY Left 06/10/2017   Procedure: L4-5 disectomy left far lateral;  Surgeon: Venita Lick, MD;  Location: [facility] OR;  Service: Orthopedics;  Laterality: Left;  120 mins   POLYPECTOMY N/A 02/18/2018   Procedure: POLYPECTOMY;  Surgeon: Midge Minium, MD;  Location: [facility];  Service: Endoscopy;  Laterality: N/A;   TRIGGER FINGER RELEASE Left        Home Medications    Prior to Admission medications   Medication Sig Start Date End Date Taking? Authorizing Provider  azithromycin (ZITHROMAX) 250 MG tablet Take 1 tablet (250 mg total) by mouth daily. Take first 2 tablets together, then 1 every day until finished. 05/09/23  Yes Mickie Bail, NP  Ascorbic Acid (VITAMIN C) 500 MG CHEW Chew 1 tablet by mouth daily.    [provider]  blood glucose meter kit and supplies KIT Dispense freestyle light. Use two times daily as directed. (FOR ICD-9 250.00, 250.01). 03/27/16   Glori Luis, MD  ezetimibe (ZETIA) 10 MG tablet Take 1 tablet (10 mg total) by mouth daily. 08/17/22   Birdie Sons,  Yehuda Mao, MD  metFORMIN (GLUCOPHAGE) 500 MG tablet Take 1 tablet (500 mg total) by mouth at bedtime. 08/17/22   Glori Luis, MD  Multiple Vitamin (MULTIVITAMIN) tablet Take 1 tablet by mouth daily.    [provider]  rosuvastatin (CRESTOR) 10 MG tablet Take 1 tablet (10 mg total) by mouth daily. 08/17/22   Glori Luis, MD    Family History Family History  Problem Relation Age of Onset   Hypertension Mother    Hypertension Father    Cancer Father        prostate   Hypertension Sister    Cancer Sister        melanoma   Kidney disease Sister     Social  History Social History   Tobacco Use   Smoking status: Never   Smokeless tobacco: Never  Vaping Use   Vaping status: Never Used  Substance Use Topics   Alcohol use: No   Drug use: No     Allergies   Indomethacin   Review of Systems Review of Systems  Constitutional:  Negative for chills and fever.  HENT:  Positive for congestion. Negative for ear pain and sore throat.   Respiratory:  Positive for cough. Negative for shortness of breath.   Cardiovascular:  Negative for chest pain and palpitations.     Physical Exam Triage Vital Signs ED Triage Vitals  Encounter Vitals Group     BP --      Systolic BP Percentile --      Diastolic BP Percentile --      Pulse Rate 05/09/23 1249 86     Resp 05/09/23 1249 18     Temp 05/09/23 1249 98.7 F (37.1 C)     Temp src --      SpO2 05/09/23 1249 96 %     Weight 05/09/23 1253 210 lb (95.3 kg)     Height 05/09/23 1253 6' (1.829 m)     Head Circumference --      Peak Flow --      Pain Score 05/09/23 1253 6     Pain Loc --      Pain Education --      Exclude from Growth Chart --    No data found.  Updated Vital Signs BP 136/71 (BP Location: Left Arm)   Pulse 86   Temp 98.7 F (37.1 C)   Resp 18   Ht 6' (1.829 m)   Wt 210 lb (95.3 kg)   SpO2 96%   BMI 28.48 kg/m   Visual Acuity Right Eye Distance:   Left Eye Distance:   Bilateral Distance:    Right Eye Near:   Left Eye Near:    Bilateral Near:     Physical Exam Vitals and nursing note reviewed.  Constitutional:      General: He is not in acute distress.    Appearance: He is well-developed.  HENT:     Right Ear: Tympanic membrane normal.     Left Ear: Tympanic membrane normal.     Nose: Congestion present.     Mouth/Throat:     Mouth: Mucous membranes are moist.     Pharynx: Oropharynx is clear.  Cardiovascular:     Rate and Rhythm: Normal rate and regular rhythm.     Heart sounds: Normal heart sounds.  Pulmonary:     Effort: Pulmonary effort is  normal. No respiratory distress.     Breath sounds: Normal breath sounds.  Musculoskeletal:  Cervical back: Neck supple.  Skin:    General: Skin is warm and dry.  Neurological:     Mental Status: He is alert.      UC Treatments / Results  Labs (all labs ordered are listed, but only abnormal results are displayed) Labs Reviewed - No data to display  EKG   Radiology No results found.  Procedures Procedures (including critical care time)  Medications Ordered in UC Medications - No data to display  Initial Impression / Assessment and Plan / UC Course  I have reviewed the triage vital signs and the nursing notes.  Pertinent labs & imaging results that were available during my care of the patient were reviewed by me and considered in my medical decision making (see chart for details).    Acute sinusitis, cough.  Patient has been symptomatic for 1 week and is not improving with OTC treatment.  Lungs are clear and O2 sat is 96% on room air.  Treating today with Zithromax as patient reports this has worked well for him in the past.  Discussed symptomatic management.  Instructed him to follow-up with his PCP if he is not improving.  Education provided on sinus infection and cough.  He agrees to plan of care.  Final Clinical Impressions(s) / UC Diagnoses   Final diagnoses:  Acute non-recurrent maxillary sinusitis  Acute cough     Discharge Instructions      Take the Zithromax as directed.  Follow-up with your primary care provider if your symptoms are not improving.      ED Prescriptions     Medication Sig Dispense Auth. Provider   azithromycin (ZITHROMAX) 250 MG tablet Take 1 tablet (250 mg total) by mouth daily. Take first 2 tablets together, then 1 every day until finished. 6 tablet Mickie Bail, NP      I have reviewed the PDMP during this encounter.   Mickie Bail, NP 05/09/23 1318

## 2023-05-14 ENCOUNTER — Ambulatory Visit (INDEPENDENT_AMBULATORY_CARE_PROVIDER_SITE_OTHER): Payer: Medicare Other | Admitting: *Deleted

## 2023-05-14 VITALS — Ht 73.0 in | Wt 223.0 lb

## 2023-05-14 DIAGNOSIS — Z0000 Encounter for general adult medical examination without abnormal findings: Secondary | ICD-10-CM

## 2023-05-14 NOTE — Patient Instructions (Signed)
Mr. Buckert , Thank you for taking time to come for your Medicare Wellness Visit. I appreciate your ongoing commitment to your health goals. Please review the following plan we discussed and let me know if I can assist you in the future.   Referrals/Orders/Follow-Ups/Clinician Recommendations: Remember to get your flu shot and pneumonia vaccine.  This is a list of the screening recommended for you and due dates:  Health Maintenance  Topic Date Due   Pneumonia Vaccine (2 of 2 - PCV) 01/27/2019   COVID-19 Vaccine (5 - 2024-25 season) 01/24/2023   Flu Shot  08/23/2023*   Hemoglobin A1C  08/19/2023   Yearly kidney function blood test for diabetes  02/19/2024   Yearly kidney health urinalysis for diabetes  02/19/2024   Complete foot exam   02/19/2024   Eye exam for diabetics  03/16/2024   Medicare Annual Wellness Visit  05/13/2024   DTaP/Tdap/Td vaccine (3 - Td or Tdap) 01/27/2028   Colon Cancer Screening  02/19/2028   Hepatitis C Screening  Completed   Zoster (Shingles) Vaccine  Completed   HPV Vaccine  Aged Out  *Topic was postponed. The date shown is not the original due date.    Advanced directives: (Copy Requested) Please bring a copy of your health care power of attorney and living will to the office to be added to your chart at your convenience.  Next Medicare Annual Wellness Visit scheduled for next year: Yes 05/16/24 @ 10:10

## 2023-05-14 NOTE — Progress Notes (Signed)
Subjective:   Christopher Woods. is a 67 y.o. male who presents for an Initial Medicare Annual Wellness Visit.  Visit Complete: Virtual I connected with  Christopher Woods. on 05/14/23 by a audio enabled telemedicine application and verified that I am speaking with the correct person using two identifiers.  Patient Location: Home  Provider Location: Home Office  I discussed the limitations of evaluation and management by telemedicine. The patient expressed understanding and agreed to proceed.  Vital Signs: Because this visit was a virtual/telehealth visit, some criteria may be missing or patient reported. Any vitals not documented were not able to be obtained and vitals that have been documented are patient reported.  Cardiac Risk Factors include: advanced age (>54men, >79 women);diabetes mellitus;dyslipidemia;male gender     Objective:    Today's Vitals   05/14/23 0857  Weight: 223 lb (101.2 kg)  Height: 6\' 1"  (1.854 m)   Body mass index is 29.42 kg/m.     05/14/2023    9:13 AM 02/18/2018    7:00 AM 06/08/2017    8:13 AM  Advanced Directives  Does Patient Have a Medical Advance Directive? Yes Yes No  Type of Estate agent of [place];Living will Healthcare Power of [place];Living will   Copy of Healthcare Power of Attorney in Chart? No - copy requested Yes   Would patient like information on creating a medical advance directive?   No - Patient declined    Current Medications (verified) Outpatient Encounter Medications as of 05/14/2023  Medication Sig   Ascorbic Acid (VITAMIN C) 500 MG CHEW Chew 1 tablet by mouth daily.   blood glucose meter kit and supplies KIT Dispense freestyle light. Use two times daily as directed. (FOR ICD-9 250.00, 250.01).   ezetimibe (ZETIA) 10 MG tablet Take 1 tablet (10 mg total) by mouth daily.   metFORMIN (GLUCOPHAGE) 500 MG tablet Take 1 tablet (500 mg total) by mouth at bedtime.   Multiple Vitamin  (MULTIVITAMIN) tablet Take 1 tablet by mouth daily.   rosuvastatin (CRESTOR) 10 MG tablet Take 1 tablet (10 mg total) by mouth daily.   [DISCONTINUED] azithromycin (ZITHROMAX) 250 MG tablet Take 1 tablet (250 mg total) by mouth daily. Take first 2 tablets together, then 1 every day until finished. (Patient not taking: Reported on 05/14/2023)   No facility-administered encounter medications on file as of 05/14/2023.    Allergies (verified) Indomethacin   History: Past Medical History:  Diagnosis Date   Arthritis    Dupuytren's disease    High coronary artery calcium score    a. 04/2017 Cardiac CT: Cor Ca2+ = 100 (68th%'ile).   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 06/10/2017   Trigeminal neuralgia    resolved   Type 2 diabetes mellitus without complication (HCC) 02/20/2015   Past Surgical History:  Procedure Laterality Date   COLONOSCOPY WITH PROPOFOL N/A 02/18/2018   Procedure: COLONOSCOPY WITH PROPOFOL WITH BIOPSY;  Surgeon: Midge Minium, MD;  [facility]: [facility];  Service: Endoscopy;  Laterality: N/A;  Diabetic- requests to be first   HAND SURGERY Right    thumb reconstruction   HIP SURGERY Left    HIP SURGERY Right    KNEE SURGERY Left    KNEE SURGERY Right    meniscal tear   LUMBAR LAMINECTOMY/DECOMPRESSION MICRODISCECTOMY Left 06/10/2017   Procedure: L4-5 disectomy left far lateral;  Surgeon: Venita Lick, MD;  Location: [facility] OR;  Service: Orthopedics;  Laterality: Left;  120 mins   POLYPECTOMY N/A 02/18/2018   Procedure: POLYPECTOMY;  Surgeon: Midge Minium, MD;  Location: [facility];  Service: Endoscopy;  Laterality: N/A;   TRIGGER FINGER RELEASE Left    Family History  Problem Relation Age of Onset   Hypertension Mother    Hypertension Father    Cancer Father        prostate   Hypertension Sister    Cancer Sister        melanoma   Kidney disease  Sister    Social History   Socioeconomic History   Marital status: Married    Spouse name: Not on file   Number of children: Not on file   Years of education: Not on file   Highest education level: Not on file  Occupational History   Not on file  Tobacco Use   Smoking status: Never   Smokeless tobacco: Never  Vaping Use   Vaping status: Never Used  Substance and Sexual Activity   Alcohol use: No   Drug use: No   Sexual activity: Not on file  Other Topics Concern   Not on file  Social History Narrative   Married   Social Drivers of Health   Financial Resource Strain: Low Risk  (05/14/2023)   Overall Financial Resource Strain (CARDIA)    Difficulty of Paying Living Expenses: Not hard at all  Food Insecurity: No Food Insecurity (05/14/2023)   Hunger Vital Sign    Worried About Running Out of Food in the Last Year: Never true    Ran Out of Food in the Last Year: Never true  Transportation Needs: No Transportation Needs (05/14/2023)   PRAPARE - Administrator, Civil Service (Medical): No    Lack of Transportation (Non-Medical): No  Physical Activity: Inactive (05/14/2023)   Exercise Vital Sign    Days of Exercise per Week: 0 days    Minutes of Exercise per Session: 0 min  Stress: No Stress Concern Present (05/14/2023)   [employer] of Occupational Health - Occupational Stress Questionnaire    Feeling of Stress : Not at all  Social Connections: Moderately Integrated (05/14/2023)   Social Connection and Isolation Panel [NHANES]    Frequency of Communication with Friends and Family: Twice a week    Frequency of Social Gatherings with Friends and Family: Twice a week    Attends Religious Services: Never    Database administrator or Organizations: Yes    Attends Engineer, structural: More than 4 times per year    Marital Status: Married    Tobacco Counseling Counseling given: Not Answered   Clinical Intake:  Pre-visit preparation  completed: Yes  Pain : No/denies pain     BMI - recorded: 29.42 Nutritional Status: BMI 25 -29 Overweight Nutritional Risks: None Diabetes: Yes CBG done?: No Did pt. bring in CBG monitor from home?: No  How often do you need to have someone help you when you read instructions, pamphlets, or other written materials from your doctor or pharmacy?: 1 - Never  Interpreter Needed?: No  Information entered by :: R,. Kandra Graven LPN   Activities of Daily Living    05/14/2023    9:00 AM  In your present state of health, do you have any difficulty performing the following activities:  Hearing? 0  Vision? 0  Comment glasses  Difficulty concentrating or making decisions? 1  Comment  since Covid  Walking or climbing stairs? 0  Dressing or bathing? 0  Doing errands, shopping? 0  Preparing Food and eating ? N  Using the Toilet? N  In the past six months, have you accidently leaked urine? N  Do you have problems with loss of bowel control? N  Managing your Medications? N  Managing your Finances? N  Housekeeping or managing your Housekeeping? N    Patient Care Team: Glori Luis, MD as PCP - General (Family Medicine) Antonieta Iba, MD as PCP - Cardiology (Cardiology)  Indicate any recent Medical Services you may have received from other than [facility] providers in the past year (date may be approximate).     Assessment:   This is a routine wellness examination for [place].  Hearing/Vision screen Hearing Screening - Comments:: No issues Vision Screening - Comments:: glasses   Goals Addressed             This Visit's Progress    Patient Stated       Wants to  start exercising some       Depression Screen    05/14/2023    9:07 AM 02/19/2023    8:08 AM 08/17/2022    8:13 AM 06/19/2022    4:23 PM 02/16/2022    8:50 AM 09/15/2021    8:19 AM 02/05/2021    8:55 AM  PHQ 2/9 Scores  PHQ - 2 Score 0 0 0 0 0 0 0  PHQ- 9 Score 2 1 5         Fall Risk    05/14/2023    9:03  AM 02/19/2023    8:08 AM 08/17/2022    8:12 AM 06/19/2022    4:23 PM 02/16/2022    8:50 AM  Fall Risk   Falls in the past year? 0 0 0 0 0  Number falls in past yr: 0 0 0 0 0  Injury with Fall? 0 0 0 0 0  Risk for fall due to : No Fall Risks No Fall Risks No Fall Risks No Fall Risks No Fall Risks  Follow up Falls prevention discussed;Falls evaluation completed Falls evaluation completed Falls evaluation completed Falls evaluation completed Falls evaluation completed    MEDICARE RISK AT HOME: Medicare Risk at Home Any stairs in or around the home?: Yes If so, are there any without handrails?: Yes Home free of loose throw rugs in walkways, pet beds, electrical cords, etc?: Yes Adequate lighting in your home to reduce risk of falls?: Yes Life alert?: No Use of a cane, walker or w/c?: No Grab bars in the bathroom?: No Shower chair or bench in shower?: Yes Elevated toilet seat or a handicapped toilet?: No   Cognitive Function:        05/14/2023    9:15 AM  6CIT Screen  What Year? 0 points  What month? 0 points  What time? 0 points  Count back from 20 0 points  Months in reverse 4 points  Repeat phrase 2 points  Total Score 6 points    Immunizations Immunization History  Administered Date(s) Administered   Influenza,inj,Quad PF,6+ Mos 01/26/2018, 02/01/2019, 02/05/2020, 02/05/2021, 02/16/2022   Influenza,inj,quad, With Preservative 02/01/2019   PFIZER(Purple Top)SARS-COV-2 Vaccination 08/12/2019, 09/06/2019, 04/07/2020   Pfizer Covid-19 Vaccine Bivalent Booster 46yrs & up 03/22/2021   Pneumococcal Polysaccharide-23 01/26/2018   Pneumococcal-Unspecified 07/08/2009   Td 05/03/2007   Td (Adult), 2 Lf Tetanus Toxid, Preservative Free 05/03/2007   Tdap 01/26/2018   Zoster Recombinant(Shingrix) 02/19/2022, 04/23/2022  TDAP status: Up to date  Flu Vaccine status: Due, Education has been provided regarding the importance of this vaccine. Advised may receive this vaccine at  local pharmacy or Health Dept. Aware to provide a copy of the vaccination record if obtained from local pharmacy or Health Dept. Verbalized acceptance and understanding.  Pneumococcal vaccine status: Due, Education has been provided regarding the importance of this vaccine. Advised may receive this vaccine at local pharmacy or Health Dept. Aware to provide a copy of the vaccination record if obtained from local pharmacy or Health Dept. Verbalized acceptance and understanding.  Covid-19 vaccine status: Information provided on how to obtain vaccines.   Qualifies for Shingles Vaccine? Yes   Zostavax completed No   Shingrix Completed?: Yes  Screening Tests Health Maintenance  Topic Date Due   Medicare Annual Wellness (AWV)  Never done   Pneumonia Vaccine 63+ Years old (2 of 2 - PCV) 01/27/2019   COVID-19 Vaccine (5 - 2024-25 season) 01/24/2023   INFLUENZA VACCINE  08/23/2023 (Originally 12/24/2022)   HEMOGLOBIN A1C  08/19/2023   Diabetic kidney evaluation - eGFR measurement  02/19/2024   Diabetic kidney evaluation - Urine ACR  02/19/2024   FOOT EXAM  02/19/2024   OPHTHALMOLOGY EXAM  03/16/2024   DTaP/Tdap/Td (3 - Td or Tdap) 01/27/2028   Colonoscopy  02/19/2028   Hepatitis C Screening  Completed   Zoster Vaccines- Shingrix  Completed   HPV VACCINES  Aged Out    Health Maintenance  Health Maintenance Due  Topic Date Due   Medicare Annual Wellness (AWV)  Never done   Pneumonia Vaccine 20+ Years old (2 of 2 - PCV) 01/27/2019   COVID-19 Vaccine (5 - 2024-25 season) 01/24/2023    Colorectal cancer screening: Type of screening: Colonoscopy. Completed 01/2018. Repeat every 10 years  Lung Cancer Screening: (Low Dose CT Chest recommended if Age 67-80 years, 20 pack-year currently smoking OR have quit w/in 15years.) does not qualify.     Additional Screening:  Hepatitis C Screening: does qualify; Completed 02/2016  Vision Screening: Recommended annual ophthalmology exams for early  detection of glaucoma and other disorders of the eye. Is the patient up to date with their annual eye exam?  Yes  Who is the provider or what is the name of the office in which the patient attends annual eye exams? [facility] If pt is not established with a provider, would they like to be referred to a provider to establish care? No .   Dental Screening: Recommended annual dental exams for proper oral hygiene  Diabetic Foot Exam: Diabetic Foot Exam: Completed 01/2023  Community Resource Referral / Chronic Care Management: CRR required this visit?  No   CCM required this visit?  No    Plan:     I have personally reviewed and noted the following in the patient's chart:   Medical and social history Use of alcohol, tobacco or illicit drugs  Current medications and supplements including opioid prescriptions. Patient is not currently taking opioid prescriptions. Functional ability and status Nutritional status Physical activity Advanced directives List of other physicians Hospitalizations, surgeries, and ER visits in previous 12 months Vitals Screenings to include cognitive, depression, and falls Referrals and appointments  In addition, I have reviewed and discussed with patient certain preventive protocols, quality metrics, and best practice recommendations. A written personalized care plan for preventive services as well as general preventive health recommendations were provided to patient.     Sydell Axon, [place]   19/14/7829  After Visit Summary: (MyChart) Due to this being a telephonic visit, the after visit summary with patients personalized plan was offered to patient via MyChart   Nurse Notes: None

## 2023-05-25 DIAGNOSIS — G620 Drug-induced polyneuropathy: Secondary | ICD-10-CM | POA: Diagnosis not present

## 2023-06-01 LAB — LAB REPORT - SCANNED: Microalb Creat Ratio: 29.999

## 2023-08-04 ENCOUNTER — Ambulatory Visit (INDEPENDENT_AMBULATORY_CARE_PROVIDER_SITE_OTHER): Payer: Self-pay | Admitting: Podiatry

## 2023-08-04 ENCOUNTER — Encounter: Payer: Self-pay | Admitting: Podiatry

## 2023-08-04 DIAGNOSIS — D2372 Other benign neoplasm of skin of left lower limb, including hip: Secondary | ICD-10-CM

## 2023-08-04 DIAGNOSIS — D2371 Other benign neoplasm of skin of right lower limb, including hip: Secondary | ICD-10-CM

## 2023-08-04 NOTE — Progress Notes (Signed)
 He presents today chief complaint of painful calluses plantar aspect of the bilateral foot.  Objective: Vital signs stable alert oriented x 3 pulses are palpable.  Neurologic sensorium is intact.  Deep tendon reflexes are intact.  Demonstrates multiple benign skin lesions plantar aspect of the forefoot most likely due to shear forces and direct pressure.  Assessment: Benign skin lesions bilateral.  Plan: Debrided all benign skin lesions today.  Debrided these with a 312 chisel blade

## 2023-08-09 ENCOUNTER — Other Ambulatory Visit: Payer: Self-pay

## 2023-08-09 DIAGNOSIS — E1169 Type 2 diabetes mellitus with other specified complication: Secondary | ICD-10-CM

## 2023-08-09 DIAGNOSIS — E119 Type 2 diabetes mellitus without complications: Secondary | ICD-10-CM

## 2023-08-09 MED ORDER — METFORMIN HCL 500 MG PO TABS: 500.0000 mg | ORAL_TABLET | Freq: Every day | ORAL | 0 refills | Status: DC

## 2023-08-09 MED ORDER — EZETIMIBE 10 MG PO TABS: 10.0000 mg | ORAL_TABLET | Freq: Every day | ORAL | 0 refills | Status: DC

## 2023-08-09 MED ORDER — ROSUVASTATIN CALCIUM 10 MG PO TABS: 10.0000 mg | ORAL_TABLET | Freq: Every day | ORAL | 0 refills | Status: DC

## 2023-08-11 ENCOUNTER — Other Ambulatory Visit: Payer: Self-pay | Admitting: Nurse Practitioner

## 2023-08-11 DIAGNOSIS — E119 Type 2 diabetes mellitus without complications: Secondary | ICD-10-CM

## 2023-08-11 DIAGNOSIS — E1169 Type 2 diabetes mellitus with other specified complication: Secondary | ICD-10-CM

## 2023-08-11 NOTE — Telephone Encounter (Signed)
 Copied from CRM [id number]. Topic: Clinical - Medication Refill >> Aug 11, 2023  3:58 PM [name] wrote: Most Recent Primary Care Visit:  Provider: Sydell Axon C  Department: [facility]  Visit Type: MEDICARE AWV, INITIAL  Date: 05/14/2023  Medication: metFORMIN (GLUCOPHAGE) 500 MG tablet ezetimibe (ZETIA) 10 MG tablet rosuvastatin (CRESTOR) 10 MG tablet   Has the patient contacted their pharmacy? Yes (Agent: If no, request that the patient contact the pharmacy for the refill. If patient does not wish to contact the pharmacy document the reason why and proceed with request.) (Agent: If yes, when and what did the pharmacy advise?)  Is this the correct pharmacy for this prescription? No If no, delete pharmacy and type the correct one.  This is the patient's preferred pharmacy:    [id number] - [place], [place] - [address] AT [facility] [address] [place] [address] Phone: [phone] Fax: [phone]   Has the prescription been filled recently? No  Is the patient out of the medication? Yes  Has the patient been seen for an appointment in the last year OR does the patient have an upcoming appointment? Yes  Can we respond through MyChart? Yes  Agent: Please be advised that Rx refills may take up to 3 business days. We ask that you follow-up with your pharmacy.

## 2023-08-14 IMAGING — US US ABDOMEN COMPLETE
1 series · 14 of 25 positions shown · non-contrast
Comparison: CT abdomen pelvis dated 02/15/2013.

CLINICAL DATA: Right-sided abdominal pain.

EXAM:
ABDOMEN ULTRASOUND COMPLETE

[Series 1: us abdomen complete · 0.17mm/px · 14 of 100 slices shown]
[im 1/100]
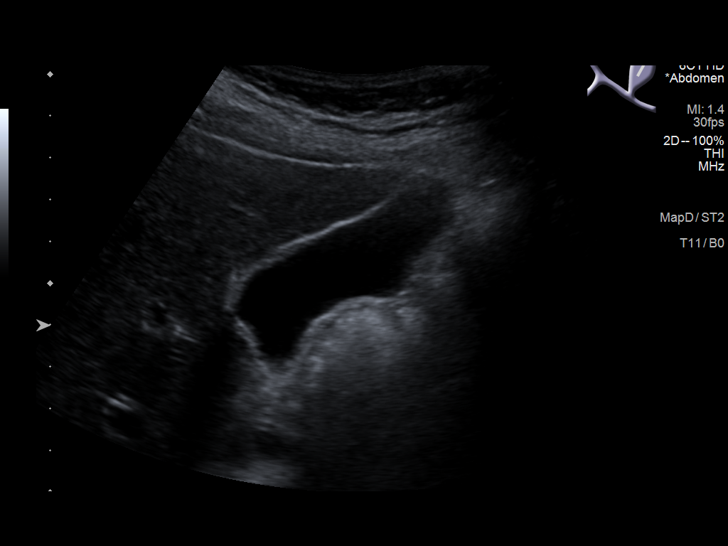
[im 9/100]
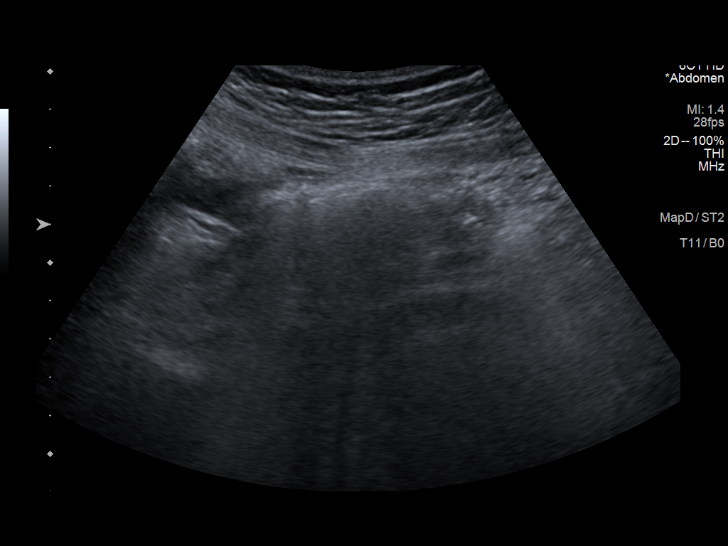
[im 17/100]
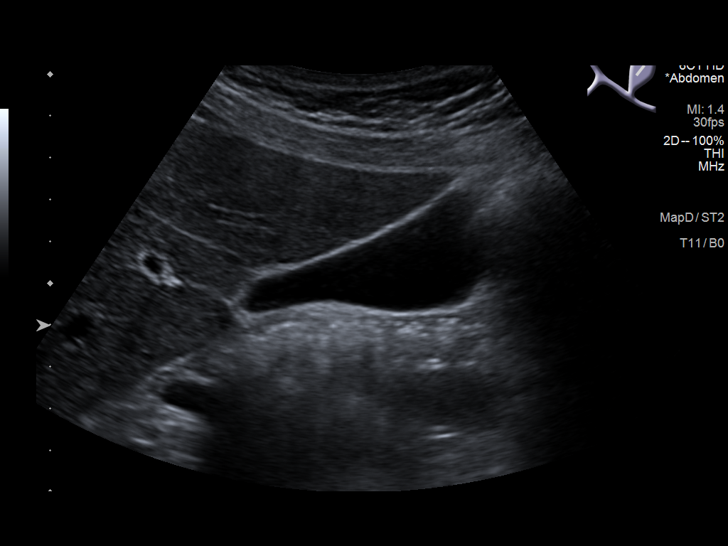
[im 25/100]
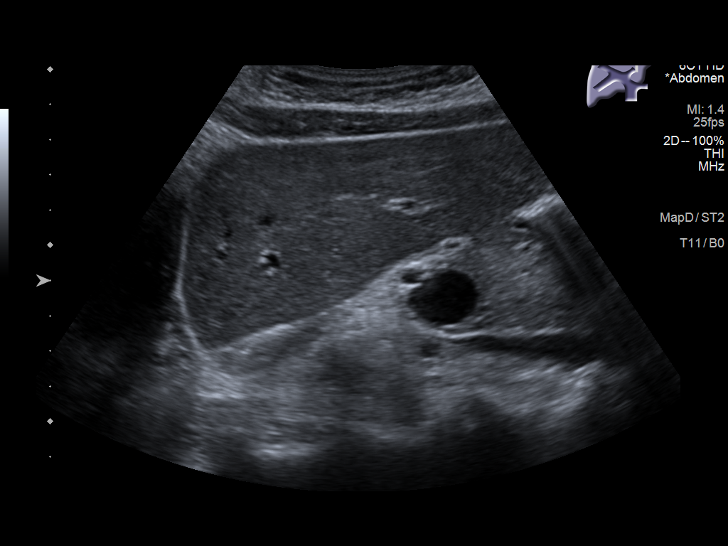
[im 34/100]
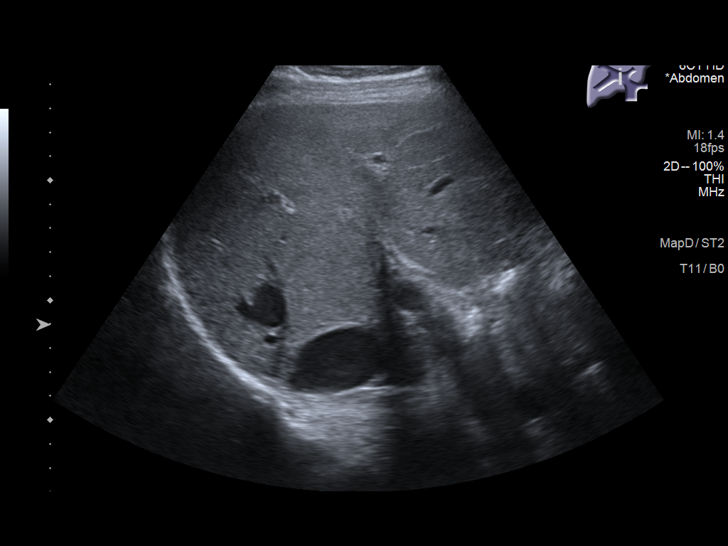
[im 38/100]
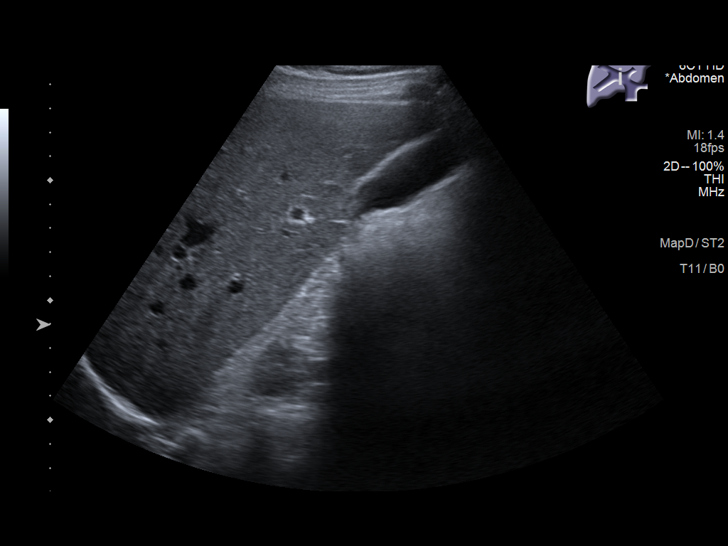
[im 46/100]
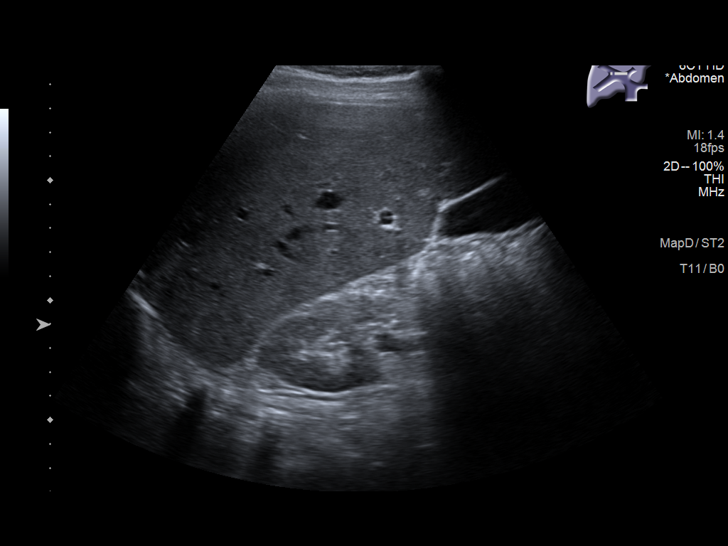
[im 54/100]
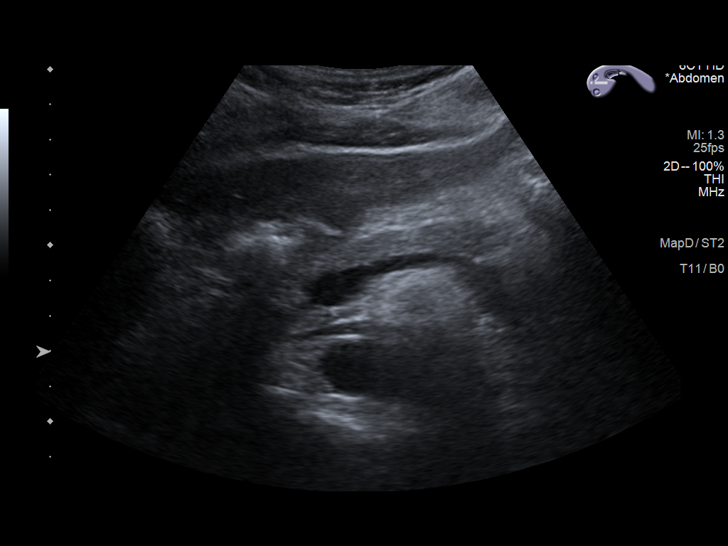
[im 62/100]
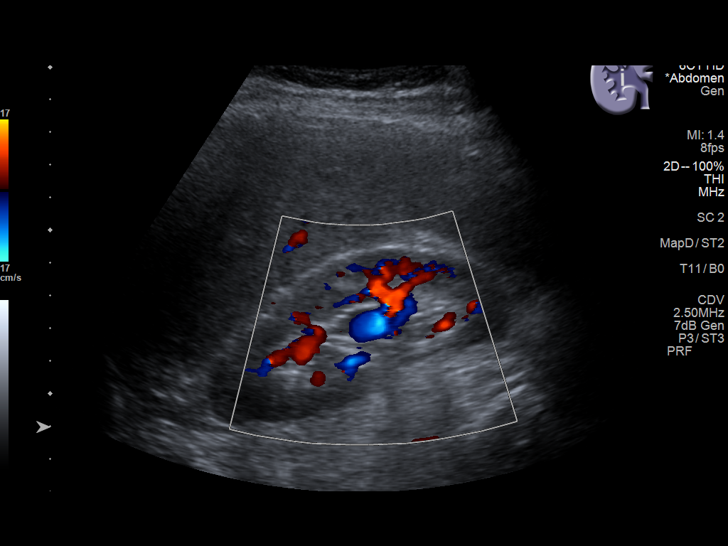
[im 67/100]
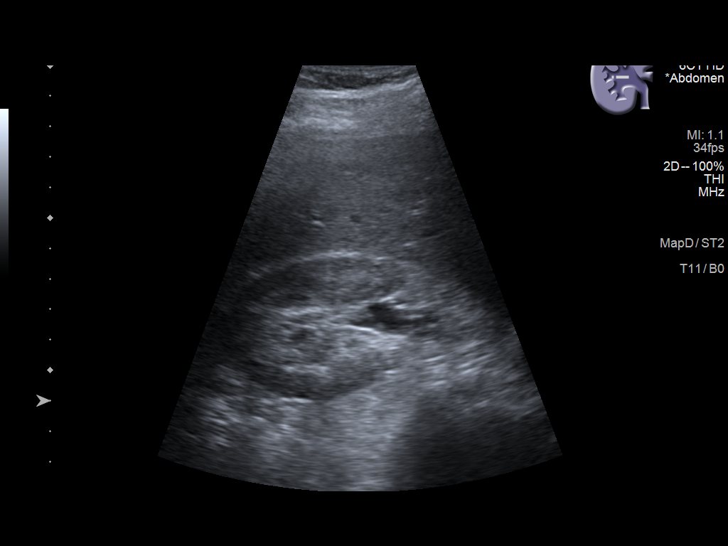
[im 75/100]
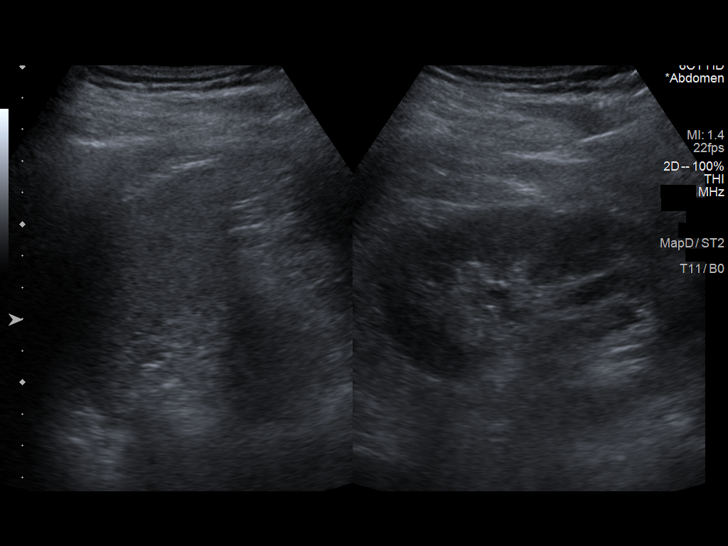
[im 83/100]
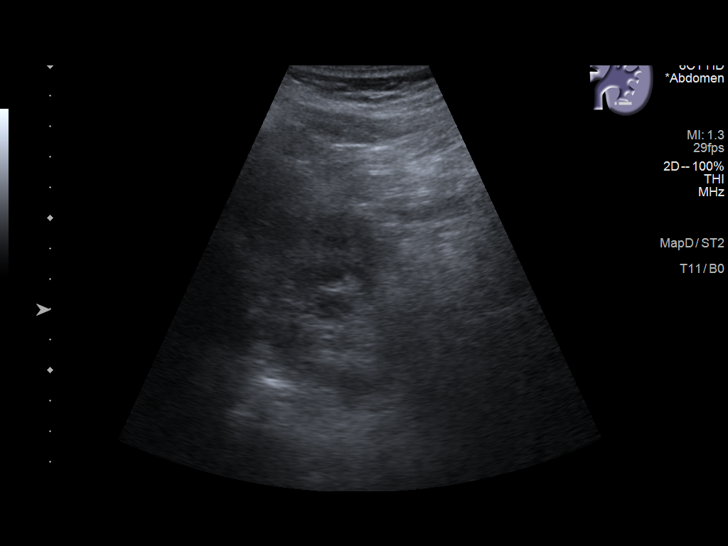
[im 91/100]
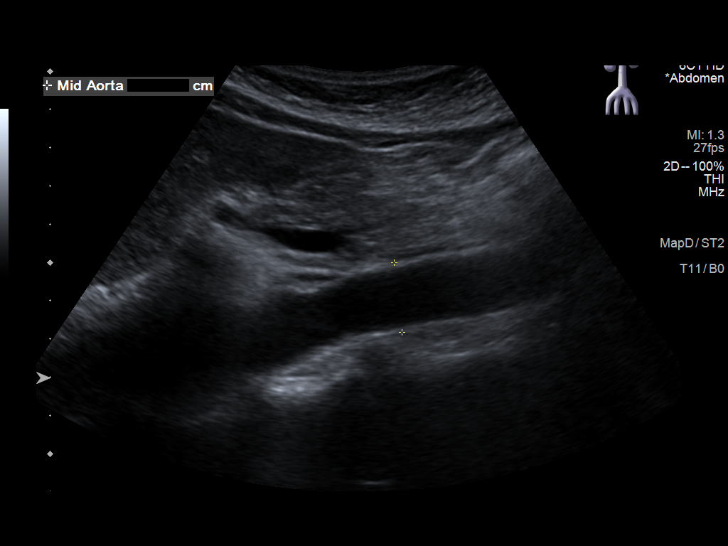
[im 100/100]
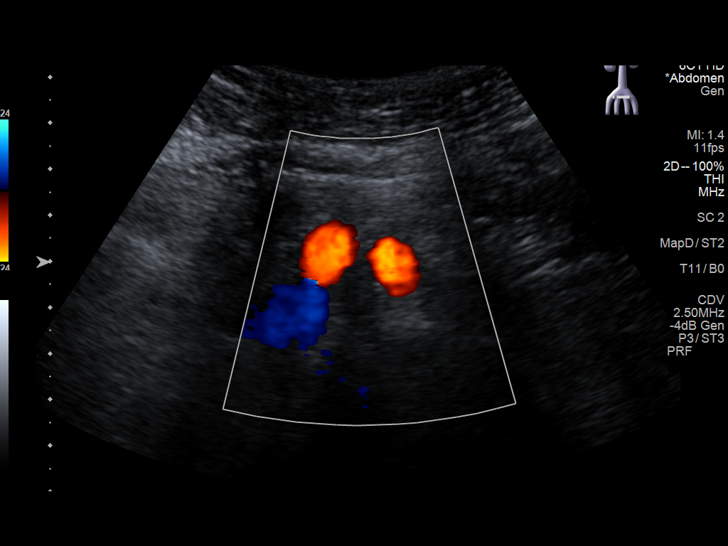

[14 of 25 positions shown; findings below may reference images not displayed]

FINDINGS: Gallbladder: No gallstones or wall thickening visualized. No
sonographic Murphy sign noted by sonographer.

Common bile duct: Diameter: 2 mm

Liver: No focal lesion identified. Within normal limits in
parenchymal echogenicity. Portal vein is patent on color Doppler
imaging with normal direction of blood flow towards the liver.

IVC: No abnormality visualized.

Pancreas: Visualized portion unremarkable.

Spleen: Size and appearance within normal limits.

Right Kidney: Length: 10.7 cm. Normal echogenicity. No
hydronephrosis or shadowing stone. There is a 1.8 x 1.6 x 2.2 cm
interpolar cyst.

Left Kidney: Length: 10.6 cm. Normal echogenicity. No hydronephrosis
or shadowing stone.

Abdominal aorta: No aneurysm visualized.

Other findings: None.
IMPRESSION: A small right renal cyst, otherwise unremarkable abdominal
ultrasound.

## 2023-08-19 NOTE — Progress Notes (Unsigned)
 Bethanie Dicker, NP-C Phone: [phone]  Christopher Woods. is a 68 y.o. male who presents today for transfer of care.   Discussed the use of AI scribe software for clinical note transcription with the patient, who gave verbal consent to proceed.  History of Present Illness   Christopher Woods. "Christopher Woods" is a 68 year old male with diabetes and hyperlipidemia who presents for a transfer of care visit.  He has diabetes, which is well-controlled with an A1c of 6.6% as of six months ago. He takes metformin, one tablet daily, and does not monitor his blood sugar at home. No symptoms of excessive thirst, excessive urination, or hypoglycemic episodes.  For hyperlipidemia, he is on Zetia and Crestor. 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 Blood tests did not indicate any issues related to Crestor.  He mentions having 'white coat syndrome' which causes elevated blood pressure readings in clinical settings. At home, his blood pressure readings are typically around 127/67 mmHg. No chest pain, shortness of breath, dizziness, or swelling.  Socially, he is semi-retired, working two days a week. He does not regularly check his blood pressure at home unless prompted by high readings during medical visits. He reports dietary habits as inconsistent, stating 'sometimes I don't eat well.'      Social History   Tobacco Use  Smoking Status Never  Smokeless Tobacco Never    Current Outpatient Medications on File Prior to Visit  Medication Sig Dispense Refill   Ascorbic Acid (VITAMIN C) 500 MG CHEW Chew 1 tablet by mouth daily.     Multiple Vitamin (MULTIVITAMIN) tablet Take 1 tablet by mouth daily.     No current facility-administered medications on file prior to visit.    ROS see history of present illness  Objective  Physical Exam Vitals:   08/20/23 0911  08/20/23 0929  BP: (!) 150/80 (!) 150/80  Pulse: 65   Temp: 97.9 F (36.6 C)   SpO2: 98%     BP Readings from Last 3 Encounters:  08/20/23 (!) 150/80  05/09/23 136/71  02/19/23 138/88   Wt Readings from Last 3 Encounters:  08/20/23 226 lb 9.6 oz (102.8 kg)  05/14/23 223 lb (101.2 kg)  05/09/23 210 lb (95.3 kg)    Physical Exam Constitutional:      General: He is not in acute distress.    Appearance: Normal appearance.  HENT:     Head: Normocephalic.  Cardiovascular:     Rate and Rhythm: Normal rate and regular rhythm.     Heart sounds: Normal heart sounds.  Pulmonary:     Effort: Pulmonary effort is normal.     Breath sounds: Normal breath sounds.  Skin:    General: Skin is warm and dry.  Neurological:     General: No focal deficit present.     Mental Status: He is alert.  Psychiatric:        Mood and Affect: Mood normal.        Behavior: Behavior normal.     Assessment/Plan: Please see individual problem list.  Diabetes mellitus type 2 in nonobese [facility]) Assessment & Plan: Diabetes is well-controlled with an A1c of 6.6% and no symptoms of hyperglycemia or hypoglycemia. POCT A1c today- 6.2%. Continue metformin 500 mg daily.   Orders: -     metFORMIN HCl; Take 1 tablet (500 mg total) by  mouth at bedtime.  Dispense: 90 tablet; Refill: 3 -     POCT glycosylated hemoglobin (Hb A1C)  Hyperlipidemia associated with type 2 diabetes mellitus (HCC) Assessment & Plan: Cholesterol is effectively managed with Zetia 10 mg daily and Crestor 10 mg daily, with no reported side effects. Continue both medications as prescribed. We will check lipid panel at next visit.   Orders: -     Ezetimibe; Take 1 tablet (10 mg total) by mouth daily.  Dispense: 90 tablet; Refill: 3 -     Rosuvastatin Calcium; Take 1 tablet (10 mg total) by mouth daily.  Dispense: 90 tablet; Refill: 3  Elevated BP without diagnosis of hypertension Assessment & Plan: Blood pressure is elevated in the  clinic x 2 readings but normal at home, with no symptoms of hypertension. He will continue to monitor at home and contact if remaining consistently elevated over 130/90.      Return in about 6 months (around 02/20/2024) for Follow up.   Bethanie Dicker, NP-C [facility]

## 2023-08-20 ENCOUNTER — Ambulatory Visit: Payer: Medicare Other | Admitting: Family Medicine

## 2023-08-20 ENCOUNTER — Encounter: Payer: Self-pay | Admitting: Nurse Practitioner

## 2023-08-20 ENCOUNTER — Ambulatory Visit: Payer: Medicare Other | Admitting: Nurse Practitioner

## 2023-08-20 VITALS — BP 150/80 | HR 65 | Temp 97.9°F | Ht 73.0 in | Wt 226.6 lb

## 2023-08-20 DIAGNOSIS — E785 Hyperlipidemia, unspecified: Secondary | ICD-10-CM | POA: Diagnosis not present

## 2023-08-20 DIAGNOSIS — E1169 Type 2 diabetes mellitus with other specified complication: Secondary | ICD-10-CM

## 2023-08-20 DIAGNOSIS — R030 Elevated blood-pressure reading, without diagnosis of hypertension: Secondary | ICD-10-CM

## 2023-08-20 DIAGNOSIS — Z7984 Long term (current) use of oral hypoglycemic drugs: Secondary | ICD-10-CM | POA: Diagnosis not present

## 2023-08-20 DIAGNOSIS — E119 Type 2 diabetes mellitus without complications: Secondary | ICD-10-CM

## 2023-08-20 LAB — POCT GLYCOSYLATED HEMOGLOBIN (HGB A1C)
HbA1c POC (<> result, manual entry): 6.2 % (ref 4.0–5.6)
HbA1c, POC (controlled diabetic range): 6.2 % (ref 0.0–7.0)
HbA1c, POC (prediabetic range): 6.2 % (ref 5.7–6.4)
Hemoglobin A1C: 6.2 % — AB (ref 4.0–5.6)

## 2023-08-20 MED ORDER — ROSUVASTATIN CALCIUM 10 MG PO TABS: 10.0000 mg | ORAL_TABLET | Freq: Every day | ORAL | 3 refills | Status: AC

## 2023-08-20 MED ORDER — EZETIMIBE 10 MG PO TABS: 10.0000 mg | ORAL_TABLET | Freq: Every day | ORAL | 3 refills | Status: AC

## 2023-08-20 MED ORDER — METFORMIN HCL 500 MG PO TABS: 500.0000 mg | ORAL_TABLET | Freq: Every day | ORAL | 3 refills | Status: DC

## 2023-08-20 NOTE — Assessment & Plan Note (Signed)
 Cholesterol is effectively managed with Zetia 10 mg daily and Crestor 10 mg daily, with no reported side effects. Continue both medications as prescribed. We will check lipid panel at next visit.

## 2023-08-20 NOTE — Assessment & Plan Note (Signed)
 Blood pressure is elevated in the clinic x 2 readings but normal at home, with no symptoms of hypertension. He will continue to monitor at home and contact if remaining consistently elevated over 130/90.

## 2023-08-20 NOTE — Assessment & Plan Note (Signed)
 Diabetes is well-controlled with an A1c of 6.6% and no symptoms of hyperglycemia or hypoglycemia. POCT A1c today- 6.2%. Continue metformin 500 mg daily.

## 2023-12-20 DIAGNOSIS — G8929 Other chronic pain: Secondary | ICD-10-CM | POA: Diagnosis not present

## 2024-02-22 ENCOUNTER — Ambulatory Visit: Admitting: Nurse Practitioner

## 2024-02-22 ENCOUNTER — Encounter: Payer: Self-pay | Admitting: Nurse Practitioner

## 2024-02-22 VITALS — BP 132/78 | HR 67 | Temp 98.1°F | Ht 73.0 in | Wt 223.2 lb

## 2024-02-22 DIAGNOSIS — Z125 Encounter for screening for malignant neoplasm of prostate: Secondary | ICD-10-CM

## 2024-02-22 DIAGNOSIS — E785 Hyperlipidemia, unspecified: Secondary | ICD-10-CM | POA: Diagnosis not present

## 2024-02-22 DIAGNOSIS — Z23 Encounter for immunization: Secondary | ICD-10-CM

## 2024-02-22 DIAGNOSIS — R030 Elevated blood-pressure reading, without diagnosis of hypertension: Secondary | ICD-10-CM | POA: Diagnosis not present

## 2024-02-22 DIAGNOSIS — Z8042 Family history of malignant neoplasm of prostate: Secondary | ICD-10-CM

## 2024-02-22 DIAGNOSIS — E1169 Type 2 diabetes mellitus with other specified complication: Secondary | ICD-10-CM

## 2024-02-22 DIAGNOSIS — M6281 Muscle weakness (generalized): Secondary | ICD-10-CM | POA: Diagnosis not present

## 2024-02-22 DIAGNOSIS — Z1329 Encounter for screening for other suspected endocrine disorder: Secondary | ICD-10-CM

## 2024-02-22 DIAGNOSIS — Z7984 Long term (current) use of oral hypoglycemic drugs: Secondary | ICD-10-CM

## 2024-02-22 DIAGNOSIS — E119 Type 2 diabetes mellitus without complications: Secondary | ICD-10-CM

## 2024-02-22 LAB — COMPREHENSIVE METABOLIC PANEL WITH GFR
ALT: 18 U/L (ref 0–53)
AST: 22 U/L (ref 0–37)
Albumin: 4.3 g/dL (ref 3.5–5.2)
Alkaline Phosphatase: 61 U/L (ref 39–117)
BUN: 17 mg/dL (ref 6–23)
CO2: 32 meq/L (ref 19–32)
Calcium: 9.4 mg/dL (ref 8.4–10.5)
Chloride: 101 meq/L (ref 96–112)
Creatinine, Ser: 0.89 mg/dL (ref 0.40–1.50)
GFR: 89.06 mL/min (ref >60.00)
Glucose, Bld: 124 mg/dL — ABNORMAL HIGH (ref 70–99)
Potassium: 4.5 meq/L (ref 3.5–5.1)
Sodium: 138 meq/L (ref 135–145)
Total Bilirubin: 0.6 mg/dL (ref 0.2–1.2)
Total Protein: 6.8 g/dL (ref 6.0–8.3)

## 2024-02-22 LAB — CBC WITH DIFFERENTIAL/PLATELET
Basophils Absolute: 0 K/uL (ref 0.0–0.1)
Basophils Relative: 0.9 % (ref 0.0–3.0)
Eosinophils Absolute: 0.1 K/uL (ref 0.0–0.7)
Eosinophils Relative: 1.4 % (ref 0.0–5.0)
HCT: 43.2 % (ref 39.0–52.0)
Hemoglobin: 14.5 g/dL (ref 13.0–17.0)
Lymphocytes Relative: 31.4 % (ref 12.0–46.0)
Lymphs Abs: 1.4 K/uL (ref 0.7–4.0)
MCHC: 33.6 g/dL (ref 30.0–36.0)
MCV: 92.9 fl (ref 78.0–100.0)
Monocytes Absolute: 0.4 K/uL (ref 0.1–1.0)
Monocytes Relative: 9.3 % (ref 3.0–12.0)
Neutro Abs: 2.6 K/uL (ref 1.4–7.7)
Neutrophils Relative %: 57 % (ref 43.0–77.0)
Platelets: 216 K/uL (ref 150.0–400.0)
RBC: 4.65 Mil/uL (ref 4.22–5.81)
RDW: 12.7 % (ref 11.5–15.5)
WBC: 4.6 K/uL (ref 4.0–10.5)

## 2024-02-22 LAB — PSA: PSA: 1.72 ng/mL (ref 0.10–4.00)

## 2024-02-22 LAB — LIPID PANEL
Cholesterol: 134 mg/dL (ref 0–200)
HDL: 49.8 mg/dL (ref >39.00)
LDL Cholesterol: 74 mg/dL (ref 0–99)
NonHDL: 83.93
Total CHOL/HDL Ratio: 3
Triglycerides: 51 mg/dL (ref 0.0–149.0)
VLDL: 10.2 mg/dL (ref 0.0–40.0)

## 2024-02-22 LAB — HEMOGLOBIN A1C: Hgb A1c MFr Bld: 6.6 % — ABNORMAL HIGH (ref 4.6–6.5)

## 2024-02-22 LAB — TESTOSTERONE: Testosterone: 306.56 ng/dL (ref 300.00–890.00)

## 2024-02-22 LAB — TSH: TSH: 2.21 u[IU]/mL (ref 0.35–5.50)

## 2024-02-22 NOTE — Assessment & Plan Note (Signed)
 Blood pressure is elevated in the clinic x 1 reading, improvement noted in second reading. He monitors his blood pressure at home, with readings typically in the high 120s to low 130s over high 70s to low 80s. BP log reviewed. He will continue to monitor at home and contact if remaining consistently elevated over 130/90.

## 2024-02-22 NOTE — Progress Notes (Signed)
 Leron Glance, NP-C Phone: [phone]  Christopher Woods. is a 68 y.o. male who presents today for follow up.   Discussed the use of AI scribe software for clinical note transcription with the patient, who gave verbal consent to proceed.  History of Present Illness   Christopher Woods. Christopher Woods is a 68 year old male who presents for a routine follow-up visit.  He is doing well overall with no new problems or concerns. He is currently taking metformin  once daily for diabetes management. His last A1c was 6.2. No symptoms of polyuria, polydipsia, or hypoglycemia.  He is on Zetia  and Crestor  for cholesterol management and denies any problems with these medications. He previously experienced myalgia and weakness, but blood tests were normal.  He monitors his blood pressure at home, with readings typically in the high 120s to low 130s over high 70s to low 80s. No symptoms of chest pain, dyspnea, dizziness, or edema.  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 He notes difficulty with heavy lifting and occasional dropping of items, but maintains a good grip overall.  He has a family history of prostate cancer in his father.      Social History   Tobacco Use  Smoking Status Never  Smokeless Tobacco Never    Current Outpatient Medications on File Prior to Visit  Medication Sig Dispense Refill   Ascorbic Acid (VITAMIN C) 500 MG CHEW Chew 1 tablet by mouth daily.     ezetimibe  (ZETIA ) 10 MG tablet Take 1 tablet (10 mg total) by mouth daily. 90 tablet 3   metFORMIN  (GLUCOPHAGE ) 500 MG tablet Take 1 tablet (500 mg total) by mouth at bedtime. 90 tablet 3   Multiple Vitamin (MULTIVITAMIN) tablet Take 1 tablet by mouth daily.     rosuvastatin  (CRESTOR ) 10 MG tablet Take 1 tablet (10 mg total) by mouth daily. 90 tablet 3   No current facility-administered medications on file prior to  visit.     ROS see history of present illness  Objective  Physical Exam Vitals:   02/22/24 0931 02/22/24 1006  BP: (!) 150/78 132/78  Pulse: 67   Temp: 98.1 F (36.7 C)   SpO2: 98%     BP Readings from Last 3 Encounters:  02/22/24 132/78  08/20/23 (!) 150/80  05/09/23 136/71   Wt Readings from Last 3 Encounters:  02/22/24 223 lb 3.2 oz (101.2 kg)  08/20/23 226 lb 9.6 oz (102.8 kg)  05/14/23 223 lb (101.2 kg)    Physical Exam Constitutional:      General: He is not in acute distress.    Appearance: Normal appearance.  HENT:     Head: Normocephalic.  Cardiovascular:     Rate and Rhythm: Normal rate and regular rhythm.     Heart sounds: Normal heart sounds.  Pulmonary:     Effort: Pulmonary effort is normal.     Breath sounds: Normal breath sounds.  Musculoskeletal:     Right shoulder: Normal strength.     Left shoulder: Normal strength.     Right hand: Normal strength.     Left hand: Normal strength.  Skin:    General: Skin is warm and dry.  Neurological:     General: No focal deficit present.     Mental Status: He is alert.     Motor: No weakness.  Psychiatric:  Mood and Affect: Mood normal.        Behavior: Behavior normal.      Assessment/Plan: Please see individual problem list.  Diabetes mellitus type 2 in nonobese [facility]) Assessment & Plan: Diabetes is well-controlled with metformin , with the last A1c at 6.2%. Check A1c today. Continue Metformin .   Orders: -     Hemoglobin A1c  Muscle weakness of upper extremity Assessment & Plan: Generalized muscle weakness is likely due to deconditioning, with a consideration of testosterone deficiency. Worse in upper extremities. Denies problems with grip, feels that he is not able to lift heavy items anymore. Check testosterone level.   Orders: -     Testosterone  Hyperlipidemia associated with type 2 diabetes mellitus (HCC) Assessment & Plan: Hyperlipidemia is managed with Zetia  and Crestor ,  with no current medication side effects. The last cholesterol check was a year ago. Check cholesterol levels today. Continue Zetia  and Crestor .   Orders: -     Lipid panel -     Comprehensive metabolic panel with GFR  Elevated BP without diagnosis of hypertension Assessment & Plan: Blood pressure is elevated in the clinic x 1 reading, improvement noted in second reading. He monitors his blood pressure at home, with readings typically in the high 120s to low 130s over high 70s to low 80s. BP log reviewed. He will continue to monitor at home and contact if remaining consistently elevated over 130/90.   Orders: -     CBC with Differential/Platelet  Need for influenza vaccination -     Flu vaccine HIGH DOSE PF(Fluzone Trivalent)  Family history of prostate cancer in father -     PSA  Screening PSA (prostate specific antigen) -     PSA  Thyroid  disorder screen -     TSH      Return in about 6 months (around 08/21/2024) for Follow up.   Leron Glance, NP-C [facility]

## 2024-02-22 NOTE — Assessment & Plan Note (Signed)
 Hyperlipidemia is managed with Zetia  and Crestor , with no current medication side effects. The last cholesterol check was a year ago. Check cholesterol levels today. Continue Zetia  and Crestor .

## 2024-02-22 NOTE — Assessment & Plan Note (Signed)
 Generalized muscle weakness is likely due to deconditioning, with a consideration of testosterone deficiency. Worse in upper extremities. Denies problems with grip, feels that he is not able to lift heavy items anymore. Check testosterone level.

## 2024-02-22 NOTE — Assessment & Plan Note (Signed)
 Diabetes is well-controlled with metformin , with the last A1c at 6.2%. Check A1c today. Continue Metformin .

## 2024-02-25 ENCOUNTER — Ambulatory Visit: Payer: Self-pay | Admitting: Nurse Practitioner

## 2024-02-25 ENCOUNTER — Other Ambulatory Visit: Payer: Self-pay | Admitting: Nurse Practitioner

## 2024-02-25 DIAGNOSIS — E119 Type 2 diabetes mellitus without complications: Secondary | ICD-10-CM

## 2024-02-25 MED ORDER — METFORMIN HCL 500 MG PO TABS: 500.0000 mg | ORAL_TABLET | Freq: Two times a day (BID) | ORAL | 3 refills | Status: AC

## 2024-03-17 DIAGNOSIS — E119 Type 2 diabetes mellitus without complications: Secondary | ICD-10-CM | POA: Diagnosis not present

## 2024-03-17 DIAGNOSIS — H25013 Cortical age-related cataract, bilateral: Secondary | ICD-10-CM | POA: Diagnosis not present

## 2024-03-17 LAB — OPHTHALMOLOGY REPORT-SCANNED

## 2024-05-16 ENCOUNTER — Ambulatory Visit: Payer: Medicare Other

## 2024-07-11 ENCOUNTER — Ambulatory Visit

## 2024-08-25 ENCOUNTER — Ambulatory Visit: Admitting: Nurse Practitioner

## 2024-09-01 ENCOUNTER — Ambulatory Visit: Admitting: Nurse Practitioner
